# Patient Record
Sex: Male | Born: 1975 | Hispanic: Yes | Marital: Married | Smoking: Never smoker
Health system: Southern US, Community
[De-identification: ages and names within clinical notes are randomized; demographics above are authoritative.]

## PROBLEM LIST (undated history)

## (undated) DIAGNOSIS — I10 Essential (primary) hypertension: Secondary | ICD-10-CM

## (undated) HISTORY — PX: APPENDECTOMY: SHX54

## (undated) HISTORY — DX: Essential (primary) hypertension: I10

---

## 2014-11-07 DIAGNOSIS — S83209A Unspecified tear of unspecified meniscus, current injury, unspecified knee, initial encounter: Secondary | ICD-10-CM | POA: Insufficient documentation

## 2015-08-25 DIAGNOSIS — E785 Hyperlipidemia, unspecified: Secondary | ICD-10-CM | POA: Insufficient documentation

## 2015-08-25 DIAGNOSIS — R195 Other fecal abnormalities: Secondary | ICD-10-CM | POA: Insufficient documentation

## 2020-03-26 ENCOUNTER — Encounter: Payer: Self-pay | Admitting: Emergency Medicine

## 2020-03-26 ENCOUNTER — Emergency Department
Admission: EM | Admit: 2020-03-26 | Discharge: 2020-03-26 | Disposition: A | Discharge status: Home / Self Care | Payer: Self-pay | Source: Home / Self Care | Attending: Family Medicine | Admitting: Family Medicine

## 2020-03-26 ENCOUNTER — Other Ambulatory Visit: Payer: Self-pay

## 2020-03-26 DIAGNOSIS — I10 Essential (primary) hypertension: Secondary | ICD-10-CM

## 2020-03-26 LAB — POCT URINALYSIS DIP (MANUAL ENTRY)
Bilirubin, UA: NEGATIVE
Glucose, UA: NEGATIVE mg/dL
Ketones, POC UA: NEGATIVE mg/dL
Leukocytes, UA: NEGATIVE
Nitrite, UA: NEGATIVE
Protein Ur, POC: NEGATIVE mg/dL
Spec Grav, UA: 1.02 (ref 1.010–1.025)
Urobilinogen, UA: 0.2 E.U./dL
pH, UA: 7.5 (ref 5.0–8.0)

## 2020-03-26 MED ORDER — AMLODIPINE BESYLATE 5 MG PO TABS
5.0000 mg | ORAL_TABLET | Freq: Every day | ORAL | 0 refills | Status: DC
Start: 2020-03-26 — End: 2020-04-03

## 2020-03-26 NOTE — Discharge Instructions (Addendum)
Please monitor your blood pressure several times weekly, at different times of the day, record on a calendar with times of measurement and take this record to your Family Doctor.  

## 2020-03-26 NOTE — ED Triage Notes (Signed)
Pt has an appt w/ pcp on 05/27/20. Original appointment was on 03/28/20. Health Insurance changed. BP has been ok per pt Denies swelling in lower extremities Increased sleepiness during the day Had a COVID vaccine

## 2020-03-29 NOTE — ED Provider Notes (Signed)
[facility]    CSN: [id number] Arrival date & time: 03/26/20  7858      History   Chief Complaint Chief Complaint  Patient presents with  . Hypertension    HPI Eugene Olsen is a 44 y.o. male.   Patient was started on amlodipine 5mg  daily for hypertension one month ago after having a full negative work-up for chest pain in the ED.  He originally had a follow-up appointment with a PCP on 03/28/20, but his insurance changed and his follow-up appointment is now 05/27/20. He reports that he has been compliant with the medication and denies adverse effects except mild sleepiness during the day.   The history is provided by the patient.    Past Medical History:  Diagnosis Date  . Hypertension     Patient Active Problem List   Diagnosis Date Noted  . Heme positive stool 08/25/2015  . Hyperlipidemia 08/25/2015  . Tear of meniscus of knee 11/07/2014    Past Surgical History:  Procedure Laterality Date  . APPENDECTOMY         Home Medications    Prior to Admission medications   Medication Sig Start Date End Date Taking? Authorizing Provider  amLODipine (NORVASC) 5 MG tablet Take 1 tablet (5 mg total) by mouth daily. 03/26/20   03/28/20, MD    Family History Family History  Problem Relation Age of Onset  . Hypertension Mother   . Hypertension Father   . Hypertension Brother     Social History Social History   Tobacco Use  . Smoking status: Never Smoker  . Smokeless tobacco: Never Used  Vaping Use  . Vaping Use: Never used  Substance Use Topics  . Alcohol use: Yes    Alcohol/week: 2.0 standard drinks    Types: 2 Standard drinks or equivalent per week  . Drug use: Never     Allergies   Patient has no known allergies.   Review of Systems Review of Systems  Constitutional: Negative for activity change, appetite change, chills, diaphoresis, fatigue and fever.  HENT: Negative.   Eyes: Negative for visual disturbance.    Respiratory: Negative.   Cardiovascular: Negative for chest pain, palpitations and leg swelling.  Genitourinary: Negative.   Musculoskeletal: Negative.   Skin: Negative.   Neurological: Negative for headaches.     Physical Exam Triage Vital Signs ED Triage Vitals  Enc Vitals Group     BP 03/26/20 0854 (!) 137/92     Pulse Rate 03/26/20 0854 72     Resp 03/26/20 0854 15     Temp 03/26/20 0854 98.6 F (37 C)     Temp Source 03/26/20 0854 Oral     SpO2 03/26/20 0854 98 %     Weight 03/26/20 0850 20 lb (9.072 kg)     Height 03/26/20 0850 5\' 10"  (1.778 m)     Head Circumference --      Peak Flow --      Pain Score 03/26/20 0855 0     Pain Loc --      Pain Edu? --      Excl. in GC? --    No data found.  Updated Vital Signs BP (!) 137/92 (BP Location: Right Arm)   Pulse 72   Temp 98.6 F (37 C) (Oral)   Resp 15   Ht 5\' 10"  (1.778 m)   Wt 9.072 kg   SpO2 98%   BMI 2.87 kg/m   Visual Acuity Right Eye Distance:  Left Eye Distance:   Bilateral Distance:    Right Eye Near:   Left Eye Near:    Bilateral Near:     Physical Exam Nursing notes and Vital Signs reviewed. Appearance:  Patient appears stated age, and in no acute distress.    Eyes:  Pupils are equal, round, and reactive to light and accomodation.  Extraocular movement is intact.  Conjunctivae are not inflamed.  Fundi benign.   Pharynx:  Normal; moist mucous membranes  Neck:  Supple.  No adenopathy Lungs:  Clear to auscultation.  Breath sounds are equal.  Moving air well. Heart:  Regular rate and rhythm without murmurs, rubs, or gallops.  Abdomen:  Nontender without masses or hepatosplenomegaly.  Bowel sounds are present.  No CVA or flank tenderness.  Extremities:  No edema.  Skin:  No rash present.     UC Treatments / Results  Labs (all labs ordered are listed, but only abnormal results are displayed) Labs Reviewed  POCT URINALYSIS DIP (MANUAL ENTRY) - Abnormal; Notable for the following components:       Result Value   Blood, UA trace-lysed (*)    All other components within normal limits    EKG   Radiology No results found.  Procedures Procedures (including critical care time)  Medications Ordered in UC Medications - No data to display  Initial Impression / Assessment and Plan / UC Course  I have reviewed the triage vital signs and the nursing notes.  Pertinent labs & imaging results that were available during my care of the patient were reviewed by me and considered in my medical decision making (see chart for details).    Refill amlodipine 5mg  daily; #30, no refill. Followup with Family Doctor in two weeks.  Final Clinical Impressions(s) / UC Diagnoses   Final diagnoses:  Essential hypertension     Discharge Instructions     Please monitor your blood pressure several times weekly, at different times of the day, record on a calendar with times of measurement and take this record to your Family Doctor.    ED Prescriptions    Medication Sig Dispense Auth. Provider   amLODipine (NORVASC) 5 MG tablet Take 1 tablet (5 mg total) by mouth daily. 30 tablet , MD        Lattie Haw, MD 03/29/20 [phone]

## 2020-04-03 ENCOUNTER — Ambulatory Visit (INDEPENDENT_AMBULATORY_CARE_PROVIDER_SITE_OTHER): Payer: 59 | Admitting: Family Medicine

## 2020-04-03 ENCOUNTER — Telehealth: Payer: Self-pay

## 2020-04-03 ENCOUNTER — Encounter: Payer: Self-pay | Admitting: Family Medicine

## 2020-04-03 VITALS — BP 129/84 | HR 77 | Temp 98.3°F | Ht 70.87 in | Wt 227.8 lb

## 2020-04-03 DIAGNOSIS — Z131 Encounter for screening for diabetes mellitus: Secondary | ICD-10-CM | POA: Insufficient documentation

## 2020-04-03 DIAGNOSIS — I10 Essential (primary) hypertension: Secondary | ICD-10-CM | POA: Diagnosis not present

## 2020-04-03 MED ORDER — AMLODIPINE BESYLATE 5 MG PO TABS
5.0000 mg | ORAL_TABLET | Freq: Every day | ORAL | 2 refills | Status: DC
Start: 2020-04-03 — End: 2020-09-30

## 2020-04-03 NOTE — Assessment & Plan Note (Addendum)
Blood pressure is at goal at for age and co-morbidities.  I recommend continuation of amlodipine at 5mg  daily.  In addition they were instructed to follow a low sodium diet with regular exercise to help to maintain adequate control of blood pressure.  Update labs today.

## 2020-04-03 NOTE — Patient Instructions (Signed)
Hipertensión en los adultos °Hypertension, Adult °La presión arterial alta (hipertensión) se produce cuando la fuerza de la sangre bombea a través de las arterias con mucha fuerza. Las arterias son los vasos sanguíneos que transportan la sangre desde el corazón al resto del cuerpo. La hipertensión hace que el corazón haga más esfuerzo para bombear sangre y puede provocar que las arterias se estrechen o endurezcan. La hipertensión no tratada o no controlada puede causar infarto de miocardio, insuficiencia cardíaca, accidente cerebrovascular, enfermedad renal y otros problemas. °Una lectura de la presión arterial consta de un número más alto sobre un número más bajo. En condiciones ideales, la presión arterial debe estar por debajo de 120/80. El primer número (“superior”) es la presión sistólica. Es la medida de la presión de las arterias cuando el corazón late. El segundo número (“inferior”) es la presión diastólica. Es la medida de la presión en las arterias cuando el corazón se relaja. °¿Cuáles son las causas? °Se desconoce la causa exacta de esta afección. Hay algunas afecciones que causan presión arterial alta o están relacionadas con ella. °¿Qué incrementa el riesgo? °Algunos factores de riesgo de hipertensión están bajo su control. Los siguientes factores pueden hacer que sea más propenso a desarrollar esta afección: °· Fumar. °· Tener diabetes mellitus tipo 2, colesterol alto, o ambos. °· No hacer la cantidad suficiente de actividad física o ejercicio. °· Tener sobrepeso. °· Consumir mucha grasa, azúcar, calorías o sal (sodio) en su dieta. °· Beber alcohol en exceso. °Algunos factores de riesgo para la presión arterial alta pueden ser difíciles o imposibles de cambiar. Algunos de estos factores son los siguientes: °· Tener enfermedad renal crónica. °· Tener antecedentes familiares de presión arterial alta. °· Edad. Los riesgos aumentan con la edad. °· Raza. El riesgo es mayor para las personas  afroamericanas. °· Sexo. Antes de los 45 años, los hombres corren más riesgo que las mujeres. Después de los 65 años, las mujeres corren más riesgo que los hombres. °· Tener apnea obstructiva del sueño. °· Estrés. °¿Cuáles son los signos o los síntomas? °Es posible que la presión arterial alta puede no cause síntomas. La presión arterial muy alta (crisis hipertensiva) puede provocar: °· Dolor de cabeza. °· Ansiedad. °· Falta de aire. °· Hemorragia nasal. °· Náuseas y vómitos. °· Cambios en la visión. °· Dolor de pecho intenso. °· Convulsiones. °¿Cómo se diagnostica? °Esta afección se diagnostica al medir su presión arterial mientras se encuentra sentado, con el brazo apoyado sobre una superficie plana, las piernas sin cruzar y los pies bien apoyados en el piso. El brazalete del tensiómetro debe colocarse directamente sobre la piel de la parte superior del brazo y al nivel de su corazón. Debe medirla al menos dos veces en el mismo brazo. Determinadas condiciones pueden causar una diferencia de presión arterial entre el brazo izquierdo y el derecho. °Ciertos factores pueden provocar que las lecturas de la presión arterial sean inferiores o superiores a lo normal por un período corto de tiempo: °· Si su presión arterial es más alta cuando se encuentra en el consultorio del médico que cuando la mide en su hogar, se denomina “hipertensión de bata blanca”. La mayoría de las personas que tienen esta afección no deben ser medicadas. °· Si su presión arterial es más alta en el hogar que cuando se encuentra en el consultorio del médico, se denomina “hipertensión enmascarada”. La mayoría de las personas que tienen esta afección deben ser medicadas para controlar la presión arterial. °Si tiene una lecturas de presión arterial alta durante   una visita o si tiene presión arterial normal con otros factores de riesgo, se le podrá pedir que haga lo siguiente: °· Que regrese otro día para volver a controlar su presión arterial  nuevamente. °· Que se controle la presión arterial en su casa durante 1 semana o más. °Si se le diagnostica hipertensión, es posible que se le realicen otros análisis de sangre o estudios de diagnóstico por imágenes para ayudar a su médico a comprender su riesgo general de tener otras afecciones. °¿Cómo se trata? °Esta afección se trata haciendo cambios saludables en el estilo de vida, tales como ingerir alimentos saludables, realizar más ejercicio y reducir el consumo de alcohol. El médico puede recetarle medicamentos si los cambios en el estilo de vida no son suficientes para lograr controlar la presión arterial y si: °· Su presión arterial sistólica está por encima de 130. °· Su presión arterial diastólica está por encima de 80. °La presión arterial deseada puede variar en función de las enfermedades, la edad y otros factores personales. °Siga estas instrucciones en su casa: °Comida y bebida ° °· Siga una dieta con alto contenido de fibras y potasio, y con bajo contenido de sodio, azúcar agregada y grasas. Un ejemplo de plan alimenticio es la dieta DASH (Dietary Approaches to Stop Hypertension, Métodos alimenticios para detener la hipertensión). Para alimentarse de esta manera: °? Coma mucha fruta y verdura fresca. Trate de que la mitad del plato de cada comida sea de frutas y verduras. °? Coma cereales integrales, como pasta integral, arroz integral o pan integral. Llene aproximadamente un cuarto del plato con cereales integrales. °? Coma y beba productos lácteos con bajo contenido de grasa, como leche descremada o yogur bajo en grasas. °? Evite la ingesta de cortes de carne grasa, carne procesada o curada, y carne de ave con piel. Llene aproximadamente un cuarto del plato con proteínas magras, como pescado, pollo sin piel, frijoles, huevos o tofu. °? Evite ingerir alimentos prehechos y procesados. En general, estos tienen mayor cantidad de sodio, azúcar agregada y grasa. °· Reduzca su ingesta diaria de sodio.  La mayoría de las personas que tienen hipertensión deben comer menos de 1500 mg de sodio por día. °· No beba alcohol si: °? Su médico le indica no hacerlo. °? Está embarazada, puede estar embarazada o está tratando de quedar embarazada. °· Si bebe alcohol: °? Limite la cantidad que bebe a lo siguiente: °§ De 0 a 1 medida por día para las mujeres. °§ De 0 a 2 medidas por día para los hombres. °? Esté atento a la cantidad de alcohol que hay en las bebidas que toma. En los [place], una medida equivale a una botella de cerveza de 12 oz (355 ml), un vaso de vino de 5 oz (148 ml) o un vaso de una bebida alcohólica de alta graduación de 1½ oz (44 ml). °Estilo de vida ° °· Trabaje con su médico para mantener un peso saludable o perder peso. Pregúntele cuál es el peso recomendado para usted. °· Haga al menos 30 minutos de ejercicio la mayoría de los días de la semana. Estas actividades pueden incluir caminar, nadar o andar en bicicleta. °· Incluya ejercicios para fortalecer sus músculos (ejercicios de resistencia), como Pilates o levantamiento de pesas, como parte de su rutina semanal de ejercicios. Intente realizar 30 minutos de este tipo de ejercicios al menos tres días a la semana. °· No consuma ningún producto que contenga nicotina o tabaco, como cigarrillos, cigarrillos electrónicos y tabaco de mascar. Si necesita ayuda para dejar de fumar, consulte al   médico. °· Contrólese la presión arterial en su casa según las indicaciones del médico. °· Concurra a todas las visitas de seguimiento como se lo haya indicado el médico. Esto es importante. °Medicamentos °· Tome los medicamentos de venta libre y los recetados solamente como se lo haya indicado el médico. Siga cuidadosamente las indicaciones. Los medicamentos para la presión arterial deben tomarse según las indicaciones. °· No omita las dosis de medicamentos para la presión arterial. Si lo hace, estará en riesgo de tener problemas y puede hacer que los medicamentos  sean menos eficaces. °· Pregúntele a su médico a qué efectos secundarios o reacciones a los medicamentos debe prestar atención. °Comuníquese con un médico si: °· Piensa que tiene una reacción a un medicamento que está tomando. °· Tiene dolores de cabeza frecuentes (recurrentes). °· Se siente mareado. °· Tiene hinchazón en los tobillos. °· Tiene problemas de visión. °Solicite ayuda inmediatamente si: °· Siente un dolor de cabeza intenso o confusión. °· Siente debilidad inusual o adormecimiento. °· Siente que va a desmayarse. °· Siente un dolor intenso en el pecho o el abdomen. °· Vomita repetidas veces. °· Tiene dificultad para respirar. °Resumen °· La hipertensión se produce cuando la sangre bombea en las arterias con mucha fuerza. Si esta afección no se controla, podría correr riesgo de tener complicaciones graves. °· La presión arterial deseada puede variar en función de las enfermedades, la edad y otros factores personales. Para la mayoría de las personas, una presión arterial normal es menor que 120/80. °· La hipertensión se trata con cambios en el estilo de vida, medicamentos o una combinación de ambos. Los cambios en el estilo de vida incluyen pérdida de peso, ingerir alimentos sanos, seguir una dieta baja en sodio, hacer más ejercicio y limitar el consumo de alcohol. °Esta información no tiene como fin reemplazar el consejo del médico. Asegúrese de hacerle al médico cualquier pregunta que tenga. °Document Revised: 04/07/2018 Document Reviewed: 04/07/2018 °Elsevier Patient Education © 2020 [employer] Inc[employer] ° °

## 2020-04-03 NOTE — Assessment & Plan Note (Addendum)
Overweight with family history of DM. Mildly elevated glucose in the ED previously.  Will check a1c today.

## 2020-04-03 NOTE — Telephone Encounter (Signed)
Copy of patient's Healthcare POA given to [employer] for scanning. (pre-labeled)

## 2020-04-03 NOTE — Progress Notes (Signed)
Pt has Moderna vaccine.  Received patient's Healthcare POA.

## 2020-04-03 NOTE — Progress Notes (Signed)
Eugene Olsen - 45 y.o. male MRN [id number]  Date of birth: 03/02/1976  Subjective Chief Complaint  Patient presents with  . Establish Care   Due to language barrier, an interpreter was present during the history-taking and subsequent discussion (and for part of the physical exam) with this patient.  HPI Eugene Olsen is a 44 y.o. male here today for initial visit.  He has history of HTN and HLD.  He also has a strong family history of colon cancer on his Mother's side.  He was seen the ER about 1 month ago with chest pain.  Found to have elevated BP.  He was started on amlodipine 5mg  daily.  Cardiac work up was negative.  He was seen again for a follow up visit last week at urgent care and BP remained well controlled with amlodipine. He was instructed to follow up with PCP.  He is here today for follow up of his BP and he needs renewal of amlodipine.  Since he has started amlodipine he has not had any chest pain, shortness of breath, palpitations, headache or vision changes.   He has gotten dizzy a couple of times while working.  Denies presyncopal feeling. He would like to have his blood sugar/a1c checked.  He does report that diabetes runs in his family.    ROS:  A comprehensive ROS was completed and negative except as noted per HPI  No Known Allergies  Past Medical History:  Diagnosis Date  . Hypertension     Past Surgical History:  Procedure Laterality Date  . APPENDECTOMY      Social History   Socioeconomic History  . Marital status: Married    Spouse name: Not on file  . Number of children: Not on file  . Years of education: Not on file  . Highest education level: Not on file  Occupational History  . Occupation: Landscaping  Tobacco Use  . Smoking status: Never Smoker  . Smokeless tobacco: Never Used  Vaping Use  . Vaping Use: Never used  Substance and Sexual Activity  . Alcohol use: Yes    Alcohol/week: 2.0 - 3.0 standard drinks    Types: 2 - 3 Cans of beer  per week  . Drug use: Never  . Sexual activity: Yes  Other Topics Concern  . Not on file  Social History Narrative  . Not on file   Social Determinants of Health   Financial Resource Strain:   . Difficulty of Paying Living Expenses: Not on file  Food Insecurity:   . Worried About in the Last Year: Not on file  . Ran Out of Food in the Last Year: Not on file  Transportation Needs:   . Lack of Transportation (Medical): Not on file  . Lack of Transportation (Non-Medical): Not on file  Physical Activity:   . Days of Exercise per Week: Not on file  . Minutes of Exercise per Session: Not on file  Stress:   . Feeling of Stress : Not on file  Social Connections:   . Frequency of Communication with Friends and Family: Not on file  . Frequency of Social Gatherings with Friends and Family: Not on file  . Attends Religious Services: Not on file  . Active Member of Clubs or Organizations: Not on file  . Attends Programme researcher, broadcasting/film/video Meetings: Not on file  . Marital Status: Not on file    Family History  Problem Relation Age of Onset  . Hypertension Mother   .  Hypertension Father   . Hypertension Brother   . Colon cancer Neg Hx        Several family members are deceased due to colon cancer    Health Maintenance  Topic Date Due  . Hepatitis C Screening  Never done  . COVID-19 Vaccine (1) Never done  . HIV Screening  Never done  . TETANUS/TDAP  Never done  . INFLUENZA VACCINE  10/03/2020 (Originally 02/04/2020)     ----------------------------------------------------------------------------------------------------------------------------------------------------------------------------------------------------------------- Physical Exam BP 129/84 (BP Location: Left Arm, Patient Position: Sitting, Cuff Size: Large)   Pulse 77   Temp 98.3 F (36.8 C) (Oral)   Ht 5' 10.87" (1.8 m)   Wt 227 lb 12.8 oz (103.3 kg)   SpO2 100%   BMI 31.89 kg/m   Physical  Exam Constitutional:      Appearance: Normal appearance.  HENT:     Head: Normocephalic and atraumatic.  Eyes:     General: No scleral icterus. Cardiovascular:     Rate and Rhythm: Normal rate and regular rhythm.  Pulmonary:     Effort: Pulmonary effort is normal.     Breath sounds: Normal breath sounds.  Musculoskeletal:     Cervical back: Neck supple.  Neurological:     Mental Status: He is alert.  Psychiatric:        Mood and Affect: Mood normal.        Behavior: Behavior normal.     ------------------------------------------------------------------------------------------------------------------------------------------------------------------------------------------------------------------- Assessment and Plan  Essential hypertension Blood pressure is at goal at for age and co-morbidities.  I recommend continuation of amlodipine at 5mg  daily.  In addition they were instructed to follow a low sodium diet with regular exercise to help to maintain adequate control of blood pressure.  Update labs today.    Screening for diabetes mellitus Overweight with family history of DM. Will check a1c today.    Meds ordered this encounter  Medications  . amLODipine (NORVASC) 5 MG tablet    Sig: Take 1 tablet (5 mg total) by mouth daily.    Dispense:  90 tablet    Refill:  2   Orders Placed This Encounter  Procedures  . COMPLETE METABOLIC PANEL WITH GFR  . CBC  . HgB A1c     Return in about 6 months (around 10/01/2020) for HTN.    This visit occurred during the SARS-CoV-2 public health emergency.  Safety protocols were in place, including screening questions prior to the visit, additional usage of staff PPE, and extensive cleaning of exam room while observing appropriate contact time as indicated for disinfecting solutions.

## 2020-04-04 LAB — CBC
HCT: 44.2 % (ref 38.5–50.0)
Hemoglobin: 14.8 g/dL (ref 13.2–17.1)
MCH: 28.8 pg (ref 27.0–33.0)
MCHC: 33.5 g/dL (ref 32.0–36.0)
MCV: 86.2 fL (ref 80.0–100.0)
MPV: 9.6 fL (ref 7.5–12.5)
Platelets: 307 10*3/uL (ref 140–400)
RBC: 5.13 10*6/uL (ref 4.20–5.80)
RDW: 12.7 % (ref 11.0–15.0)
WBC: 6.4 10*3/uL (ref 3.8–10.8)

## 2020-04-04 LAB — COMPLETE METABOLIC PANEL WITH GFR
AG Ratio: 1.6 (calc) (ref 1.0–2.5)
ALT: 19 U/L (ref 9–46)
AST: 18 U/L (ref 10–40)
Albumin: 4.9 g/dL (ref 3.6–5.1)
Alkaline phosphatase (APISO): 57 U/L (ref 36–130)
BUN: 18 mg/dL (ref 7–25)
CO2: 28 mmol/L (ref 20–32)
Calcium: 10.1 mg/dL (ref 8.6–10.3)
Chloride: 104 mmol/L (ref 98–110)
Creat: 0.78 mg/dL (ref 0.60–1.35)
GFR, Est African American: 127 mL/min/{1.73_m2} (ref >=60)
GFR, Est Non African American: 110 mL/min/{1.73_m2} (ref >=60)
Globulin: 3.1 g/dL (calc) (ref 1.9–3.7)
Glucose, Bld: 89 mg/dL (ref 65–139)
Potassium: 3.9 mmol/L (ref 3.5–5.3)
Sodium: 139 mmol/L (ref 135–146)
Total Bilirubin: 0.4 mg/dL (ref 0.2–1.2)
Total Protein: 8 g/dL (ref 6.1–8.1)

## 2020-04-04 LAB — HEMOGLOBIN A1C
Hgb A1c MFr Bld: 5.4 % of total Hgb (ref <5.7)
Mean Plasma Glucose: 108 (calc)
eAG (mmol/L): 6 (calc)

## 2020-05-28 ENCOUNTER — Ambulatory Visit: Payer: Self-pay | Admitting: Emergency Medicine

## 2020-06-06 ENCOUNTER — Telehealth: Payer: Self-pay

## 2020-06-06 NOTE — Telephone Encounter (Signed)
Documentation left by patient has been signed by Dr. Ashley Royalty.   Given to [employer] for scanning.  Front Desk to contact the family for pick-up.

## 2020-06-07 ENCOUNTER — Telehealth: Payer: Self-pay

## 2020-06-07 NOTE — Telephone Encounter (Signed)
Patient's documentation was completed by Dr. Ashley Royalty.   Original left at [employer] for pick-up.  Patient has been advised.

## 2020-08-01 ENCOUNTER — Other Ambulatory Visit: Payer: Self-pay

## 2020-08-01 ENCOUNTER — Ambulatory Visit (INDEPENDENT_AMBULATORY_CARE_PROVIDER_SITE_OTHER): Payer: 59 | Admitting: Nurse Practitioner

## 2020-08-01 ENCOUNTER — Encounter: Payer: Self-pay | Admitting: Nurse Practitioner

## 2020-08-01 VITALS — BP 135/94 | HR 79 | Wt 240.0 lb

## 2020-08-01 DIAGNOSIS — Z86010 Personal history of colonic polyps: Secondary | ICD-10-CM

## 2020-08-01 DIAGNOSIS — R198 Other specified symptoms and signs involving the digestive system and abdomen: Secondary | ICD-10-CM | POA: Diagnosis not present

## 2020-08-01 DIAGNOSIS — Z1211 Encounter for screening for malignant neoplasm of colon: Secondary | ICD-10-CM

## 2020-08-01 MED ORDER — PANTOPRAZOLE SODIUM 40 MG PO TBEC: 40.0000 mg | DELAYED_RELEASE_TABLET | Freq: Two times a day (BID) | ORAL | 3 refills | Status: AC

## 2020-08-01 MED ORDER — SUCRALFATE 1 G PO TABS: 1.0000 g | ORAL_TABLET | Freq: Three times a day (TID) | ORAL | 1 refills | Status: DC

## 2020-08-01 NOTE — Progress Notes (Signed)
RM-2   Pt.stated it started 2 months ago    having pains in mid section of his stomach  took OTC Maalox  pain only persist at night time.

## 2020-08-01 NOTE — Patient Instructions (Addendum)
lcera pptica Peptic Ulcer  Una lcera pptica es una llaga dolorosa en la membrana que recubre el estmago o la primera parte del intestino delgado. Cules son las causas? Las causas ms frecuentes de esta afeccin incluyen las siguientes:  Infeccin.  El uso de determinados medicamentos con mucha frecuencia o en gran cantidad. Qu incrementa el riesgo? Es ms probable que tenga esta afeccin si:  Fuma.  Tiene antecedentes familiares de lceras.  Bebe alcohol.  Ha sido hospitalizado en una unidad de cuidados intensivos [place]). Cules son los signos o los sntomas? Algunos de los sntomas son los siguientes:  Dolor urente en la zona entre el pecho y el ombligo. El dolor puede tener las siguientes caractersticas: ? No desaparece (es persistente). ? Empeora cuando el estmago est vaco. ? Empeora por la noche.  Acidez estomacal.  Malestar estomacal (nuseas) y vmitos.  Meteorismo. Si la lcera sangra, puede causar que usted:  Tenga materia fecal (heces) de color negro y de aspecto alquitranado.  Vomite sangre roja brillante.  Vomite materia parecida a la borra de caf. Cmo se trata? El tratamiento de esta afeccin puede incluir lo siguiente:  Automotive engineer cosas que pueden causar la lcera, por ejemplo: ? Fumar. ? Tomar analgsicos.  Medicamentos para reducir la Merchant navy officer.  Antibiticos, si la lcera es causada por una infeccin.  Un procedimiento que se realiza con un tubo pequeo y flexible que tiene una cmara en el extremo (endoscopa alta). Este procedimiento puede hacerse si tiene una lcera sangrante.  [place]. Esta puede ser necesaria en los siguientes casos: ? Tiene mucho sangrado. ? La lcera caus una perforacin en algn lugar del aparato digestivo. Siga estas indicaciones en su casa:  No beba alcohol si el mdico se lo prohbe.  Limite la cantidad de cafena que consume.  No consuma ningn producto que contenga nicotina o tabaco,  como cigarrillos, cigarrillos electrnicos y tabaco de Theatre manager. Si necesita ayuda para dejar de fumar, consulte al [employer].  [employer] de venta libre y los recetados solamente como se lo haya indicado el mdico. ? No cambie ni deje de tomar sus medicamentos a menos que consulte al mdico antes. ? No tome aspirina, ibuprofeno ni otros antiinflamatorios no esteroideos (AINE) a menos que el mdico se lo haya indicado.  Concurra a todas las visitas de [address] se lo haya indicado el mdico. Esto es importante. Comunquese con un mdico si:  No mejora luego de 7 das despus de [employer]  Contina sintiendo Journalist, newspaper (indigestin) o acidez estomacal. Solicite ayuda inmediatamente si:  Tiene dolor de vientre (abdomen) repentino y agudo.  Tiene dolor en el vientre que no desaparece.  La materia fecal (heces) es sanguinolenta o negra, de aspecto alquitranado.  Vomita sangre. Esto tiene un aspecto similar al poso del caf.  Se siente mareado o como si se fuera a desmayar.  Se debilita.  Est muy transpirado o se siente pegajoso o fro al tacto (sudoroso). Resumen  Los sntomas de una lcera pptica son dolor urente en la zona entre el pecho y el ombligo.  Tome los medicamentos solamente como se lo haya indicado el mdico.  Limite la cantidad de alcohol y cafena que consume.  Concurra a todas las visitas de seguimiento como se lo haya indicado el mdico. Esta informacin no tiene Theme park manager el consejo del mdico. Asegrese de hacerle al mdico cualquier pregunta que tenga. Document Revised: 02/03/2018 Document Reviewed: 02/03/2018 Elsevier Patient Education  2021 Elsevier  Inc.   Peptic Ulcer  A peptic ulcer is a painful sore in the lining of your stomach or the first part of your small intestine. What are the causes? Common causes of this condition include: An infection. Using certain pain medicines too often or too  much. What increases the risk? You are more likely to get this condition if you: Smoke. Have a family history of ulcer disease. Drink alcohol. Have been hospitalized in an intensive care unit (ICU). What are the signs or symptoms? Symptoms include: Burning pain in the area between the chest and the belly button. The pain may: Not go away (be persistent). Be worse when your stomach is empty. Be worse at night. Heartburn. Feeling sick to your stomach (nauseous) and throwing up (vomiting). Bloating. If the ulcer results in bleeding, it can cause you to: Have poop (stool) that is black and looks like tar. Throw up bright red blood. Throw up material that looks like coffee grounds. How is this treated? Treatment for this condition may include: Stopping things that can cause the ulcer, such as: Smoking. Using pain medicines. Medicines to reduce stomach acid. Antibiotic medicines if the ulcer is caused by an infection. A procedure that is done using a small, flexible tube that has a camera at the end (upper endoscopy). This may be done if you have a bleeding ulcer. Surgery. This may be needed if: You have a lot of bleeding. The ulcer caused a hole somewhere in the digestive system. Follow these instructions at home: Do not drink alcohol if your doctor tells you not to drink. Limit how much caffeine you take in. Do not use any products that contain nicotine or tobacco, such as cigarettes, e-cigarettes, and chewing tobacco. If you need help quitting, ask your doctor. Take over-the-counter and prescription medicines only as told by your doctor. Do not stop or change your medicines unless you talk with your doctor about it first. Do not take aspirin, ibuprofen, or other NSAIDs unless your doctor told you to do so. Keep all follow-up visits as told by your doctor. This is important. Contact a doctor if: You do not get better in 7 days after you start treatment. You keep having an upset  stomach (indigestion) or heartburn. Get help right away if: You have sudden, sharp pain in your belly (abdomen). You have belly pain that does not go away. You have bloody poop (stool) or black, tarry poop. You throw up blood. It may look like coffee grounds. You feel light-headed or feel like you may pass out (faint). You get weak. You get sweaty or feel sticky and cold to the touch (clammy). Summary Symptoms of a peptic ulcer include burning pain in the area between the chest and the belly button. Take medicines only as told by your doctor. Limit how much alcohol and caffeine you have. Keep all follow-up visits as told by your doctor. This information is not intended to replace advice given to you by your health care provider. Make sure you discuss any questions you have with your health care provider. Document Revised: 12/28/2017 Document Reviewed: 12/28/2017 Elsevier Patient Education  2021 Elsevier Inc.    [place] Diet A bland diet consists of foods that are often soft and do not have a lot of fat, fiber, or extra seasonings. Foods without fat, fiber, or seasoning are easier for the body to digest. They are also less likely to irritate your mouth, throat, stomach, and other parts of your digestive system. A bland  diet is sometimes called a BRAT diet. What is my plan? Your health care provider or food and nutrition specialist (dietitian) may recommend specific changes to your diet to prevent symptoms or to treat your symptoms. These changes may include:  Eating small meals often.  Cooking food until it is soft enough to chew easily.  Chewing your food well.  Drinking fluids slowly.  Not eating foods that are very spicy, sour, or fatty.  Not eating citrus fruits, such as oranges and grapefruit. What do I need to know about this diet?  Eat a variety of foods from the bland diet food list.  Do not follow a bland diet longer than needed.  Ask your health care provider whether  you should take vitamins or supplements. What foods can I eat? Grains Hot cereals, such as cream of wheat. Rice. Bread, crackers, or tortillas made from refined white flour.   Vegetables Canned or cooked vegetables. Mashed or boiled potatoes. Fruits Bananas. Applesauce. Other types of cooked or canned fruit with the skin and seeds removed, such as canned peaches or pears.   Meats and other proteins Scrambled eggs. Creamy peanut butter or other nut butters. Lean, well-cooked meats, such as chicken or fish. Tofu. Soups or broths.   Dairy Low-fat dairy products, such as milk, cottage cheese, or yogurt. Beverages Water. Herbal tea. Apple juice.   Fats and oils Mild salad dressings. Canola or olive oil. Sweets and desserts Pudding. Custard. Fruit gelatin. Ice cream. The items listed above may not be a complete list of recommended foods and beverages. Contact a dietitian for more options. What foods are not recommended? Grains Whole grain breads and cereals. Vegetables Raw vegetables. Fruits Raw fruits, especially citrus, berries, or dried fruits. Dairy Whole fat dairy foods. Beverages Caffeinated drinks. Alcohol. Seasonings and condiments Strongly flavored seasonings or condiments. Hot sauce. Salsa. Other foods Spicy foods. Fried foods. Sour foods, such as pickled or fermented foods. Foods with high sugar content. Foods high in fiber. The items listed above may not be a complete list of foods and beverages to avoid. Contact a dietitian for more information. Summary  A bland diet consists of foods that are often soft and do not have a lot of fat, fiber, or extra seasonings.  Foods without fat, fiber, or seasoning are easier for the body to digest.  Check with your health care provider to see how long you should follow this diet plan. It is not meant to be followed for long periods. This information is not intended to replace advice given to you by your health care provider. Make  sure you discuss any questions you have with your health care provider. Document Revised: 07/21/2017 Document Reviewed: 07/21/2017 Elsevier Patient Education  2021 [employer].

## 2020-08-01 NOTE — Assessment & Plan Note (Signed)
Symptoms and presentation consistent with peptic ulcer. Will start with treatment today of pantoprazole BID x 2 weeks then taper to once a day for [redacted] weeks along with carafate 4 times a day for 4-6 weeks.  Dietary recommendations provided.  If symptoms are not improved with treatment, recommend testing for H. Pylori.  Patient to follow-up if symptoms worsen or fail to improve.

## 2020-08-01 NOTE — Progress Notes (Signed)
Acute Office Visit  Subjective:    Patient ID: Eugene Olsen, male    DOB: 12/28/1975, 45 y.o.   MRN: [id number]  Chief Complaint  Patient presents with  . Abdominal Pain    HPI Patient is in today for pain in the "pit" of his stomach.   ABDOMINAL PAIN  Duration:months Onset: onset over time, but not necessarily worsening Severity: 8/10 Quality: ill-defined and sore Location:  LUQ, RUQ, peri-umbilical, LLQ, RLQ, diffuse and suprapubic". "lower abdominal quadrants  Episode duration:  Radiation: no Frequency: intermittent Alleviating factors:  Aggravating factors: Status: better, worse, stable and fluctuating Treatments attempted: antacids Fever: no Nausea: no Vomiting: no Weight loss: no Decreased appetite: no Diarrhea: no Constipation: no Blood in stool: no Heartburn: yes Jaundice: no Rash: no Recurrent NSAID use: no   Past Medical History:  Diagnosis Date  . Hypertension     Past Surgical History:  Procedure Laterality Date  . APPENDECTOMY      Family History  Problem Relation Age of Onset  . Hypertension Mother   . Hypertension Father   . Hypertension Brother   . Colon cancer Neg Hx        Several family members are deceased due to colon cancer    Social History   Socioeconomic History  . Marital status: Married    Spouse name: Not on file  . Number of children: Not on file  . Years of education: Not on file  . Highest education level: Not on file  Occupational History  . Occupation: Landscaping  Tobacco Use  . Smoking status: Never Smoker  . Smokeless tobacco: Never Used  Vaping Use  . Vaping Use: Never used  Substance and Sexual Activity  . Alcohol use: Yes    Alcohol/week: 2.0 - 3.0 standard drinks    Types: 2 - 3 Cans of beer per week  . Drug use: Never  . Sexual activity: Yes  Other Topics Concern  . Not on file  Social History Narrative  . Not on file   Social Determinants of Health   Financial Resource Strain: Not on  file  Food Insecurity: Not on file  Transportation Needs: Not on file  Physical Activity: Not on file  Stress: Not on file  Social Connections: Not on file  Intimate Partner Violence: Not on file    Outpatient Medications Prior to Visit  Medication Sig Dispense Refill  . amLODipine (NORVASC) 5 MG tablet Take 1 tablet (5 mg total) by mouth daily. 90 tablet 2   No facility-administered medications prior to visit.    No Known Allergies  Review of Systems All review of systems negative except what is listed in the HPI     Objective:    Physical Exam Vitals and nursing note reviewed.  Constitutional:      Appearance: Normal appearance.  HENT:     Head: Normocephalic.  Eyes:     Extraocular Movements: Extraocular movements intact.     Conjunctiva/sclera: Conjunctivae normal.     Pupils: Pupils are equal, round, and reactive to light.  Neck:     Vascular: No carotid bruit.  Cardiovascular:     Rate and Rhythm: Normal rate and regular rhythm.     Pulses: Normal pulses.     Heart sounds: Normal heart sounds.  Pulmonary:     Effort: Pulmonary effort is normal.     Breath sounds: Normal breath sounds.  Abdominal:     General: Abdomen is flat. Bowel sounds are normal.  Palpations: Abdomen is soft.     Tenderness: There is abdominal tenderness in the epigastric area. There is no right CVA tenderness, left CVA tenderness, guarding or rebound. Negative signs include Murphy's sign, Rovsing's sign and McBurney's sign.  Musculoskeletal:        General: Normal range of motion.     Right lower leg: No edema.     Left lower leg: No edema.  Skin:    General: Skin is warm and dry.     Capillary Refill: Capillary refill takes less than 2 seconds.  Neurological:     General: No focal deficit present.     Mental Status: He is alert and oriented to person, place, and time.  Psychiatric:        Mood and Affect: Mood normal.        Behavior: Behavior normal.        Thought Content:  Thought content normal.        Judgment: Judgment normal.     BP (!) 135/94   Pulse 79   Wt 240 lb (108.9 kg)   SpO2 98%   BMI 33.60 kg/m  Wt Readings from Last 3 Encounters:  08/01/20 240 lb (108.9 kg)  04/03/20 227 lb 12.8 oz (103.3 kg)  03/26/20 20 lb (9.072 kg)    Health Maintenance Due  Topic Date Due  . Hepatitis C Screening  Never done  . HIV Screening  Never done  . TETANUS/TDAP  Never done    There are no preventive care reminders to display for this patient.   No results found for: TSH Lab Results  Component Value Date   WBC 6.4 04/03/2020   HGB 14.8 04/03/2020   HCT 44.2 04/03/2020   MCV 86.2 04/03/2020   PLT 307 04/03/2020   Lab Results  Component Value Date   NA 139 04/03/2020   K 3.9 04/03/2020   CO2 28 04/03/2020   GLUCOSE 89 04/03/2020   BUN 18 04/03/2020   CREATININE 0.78 04/03/2020   BILITOT 0.4 04/03/2020   AST 18 04/03/2020   ALT 19 04/03/2020   PROT 8.0 04/03/2020   CALCIUM 10.1 04/03/2020   No results found for: CHOL No results found for: HDL No results found for: LDLCALC No results found for: TRIG No results found for: CHOLHDL Lab Results  Component Value Date   HGBA1C 5.4 04/03/2020       Assessment & Plan:   Problem List Items Addressed This Visit      Other   Peptic ulcer symptoms - Primary    Symptoms and presentation consistent with peptic ulcer. Will start with treatment today of pantoprazole BID x 2 weeks then taper to once a day for [redacted] weeks along with carafate 4 times a day for 4-6 weeks.  Dietary recommendations provided.  If symptoms are not improved with treatment, recommend testing for H. Pylori.  Patient to follow-up if symptoms worsen or fail to improve.       Relevant Medications   pantoprazole (PROTONIX) 40 MG tablet   sucralfate (CARAFATE) 1 g tablet    Other Visit Diagnoses    History of colon polyps       Referral for colonoscopy sent- patient reports he is due for 5 year check up    Relevant  Orders   Ambulatory referral to Gastroenterology   Colon cancer screening       order for colonoscopy sent- patient reports he is due for 5 year monitoring due to colon polyps  Relevant Orders   Ambulatory referral to Gastroenterology       Meds ordered this encounter  Medications  . pantoprazole (PROTONIX) 40 MG tablet    Sig: Take 1 tablet (40 mg total) by mouth 2 (two) times daily. For two weeks then decrease to 1 (one) time a day for four more weeks.    Dispense:  60 tablet    Refill:  3  . sucralfate (CARAFATE) 1 g tablet    Sig: Take 1 tablet (1 g total) by mouth 4 (four) times daily -  with meals and at bedtime. If symptoms are gone, may stop after 4 weeks.    Dispense:  120 tablet    Refill:  1   Return if symptoms worsen or fail to improve.   Tollie Eth, NP

## 2020-08-04 ENCOUNTER — Other Ambulatory Visit: Payer: Self-pay

## 2020-08-04 ENCOUNTER — Emergency Department
Admission: EM | Admit: 2020-08-04 | Discharge: 2020-08-04 | Disposition: A | Discharge status: Home / Self Care | Payer: 59 | Source: Home / Self Care | Attending: Family Medicine | Admitting: Family Medicine

## 2020-08-04 DIAGNOSIS — R1013 Epigastric pain: Secondary | ICD-10-CM

## 2020-08-04 MED ORDER — TRAMADOL-ACETAMINOPHEN 37.5-325 MG PO TABS: 1.0000 | ORAL_TABLET | Freq: Four times a day (QID) | ORAL | 0 refills | Status: DC | PRN

## 2020-08-04 NOTE — Discharge Instructions (Signed)
Continue taking the medicines that were prescribed to you earlier this week Take Ultracet if needed when pain is severe Caution drowsiness I will contact your primary care doctor to see about getting additional testing and specialist appointment

## 2020-08-04 NOTE — ED Triage Notes (Signed)
Pt states that he has some abdominal pain x3 weeks

## 2020-08-05 ENCOUNTER — Telehealth: Payer: Self-pay | Admitting: Emergency Medicine

## 2020-08-05 NOTE — ED Provider Notes (Signed)
[facility]    CSN: [id number] Arrival date & time: 08/04/20  1412      History   Chief Complaint Chief Complaint  Patient presents with  . Abdominal Pain    HPI Eugene Olsen is a 45 y.o. male.   HPI   Patient was seen 3 days ago by his primary care provider.  They diagnosed him with possible peptic ulcers disease.  The doctor prescribed Protonix 40 mg twice daily as well as Carafate.  He has been taking these for almost 3 days.  He states that the pain is becoming worse over time and is now what he considers intolerable.  He is here for further evaluation.  He expresses he feels blood work should have been done.  He is scheduled to follow-up with gastroenterology but does not know when this might happen.  He does have a primary care doctor to follow-up with. States the pain is unrelated to meals.  It is present all day long.  He states it slightly improved when he lays down at night.  He has some nausea but no vomiting.  No problems with bowels.  No fever or chills.  No prior history of upper GI problems ulcers or reflux. Denies cigarette smoking Denies alcohol ingestion Denies use of NSAID drugs  Past Medical History:  Diagnosis Date  . Hypertension     Patient Active Problem List   Diagnosis Date Noted  . Peptic ulcer symptoms 08/01/2020  . Essential hypertension 04/03/2020  . Screening for diabetes mellitus 04/03/2020  . Heme positive stool 08/25/2015  . Hyperlipidemia 08/25/2015  . Tear of meniscus of knee 11/07/2014    Past Surgical History:  Procedure Laterality Date  . APPENDECTOMY         Home Medications    Prior to Admission medications   Medication Sig Start Date End Date Taking? Authorizing Provider  amLODipine (NORVASC) 5 MG tablet Take 1 tablet (5 mg total) by mouth daily. 04/03/20  Yes Everrett Coombe, DO  pantoprazole (PROTONIX) 40 MG tablet Take 1 tablet (40 mg total) by mouth 2 (two) times daily. For two weeks then decrease to 1  (one) time a day for four more weeks. 08/01/20  Yes Early, Sung Amabile, NP  sucralfate (CARAFATE) 1 g tablet Take 1 tablet (1 g total) by mouth 4 (four) times daily -  with meals and at bedtime. If symptoms are gone, may stop after 4 weeks. 08/01/20  Yes Early, Sung Amabile, NP  traMADol-acetaminophen (ULTRACET) 37.5-325 MG tablet Take 1-2 tablets by mouth every 6 (six) hours as needed. 08/04/20  Yes Eustace Moore, MD    Family History Family History  Problem Relation Age of Onset  . Hypertension Mother   . Hypertension Father   . Hypertension Brother   . Colon cancer Neg Hx        Several family members are deceased due to colon cancer    Social History Social History   Tobacco Use  . Smoking status: Never Smoker  . Smokeless tobacco: Never Used  Vaping Use  . Vaping Use: Never used  Substance Use Topics  . Alcohol use: Yes    Alcohol/week: 2.0 - 3.0 standard drinks    Types: 2 - 3 Cans of beer per week  . Drug use: Never     Allergies   Patient has no known allergies.   Review of Systems Review of Systems See HPI Physical Exam Triage Vital Signs ED Triage Vitals  Enc Vitals  Group     BP 08/04/20 1543 131/86     Pulse Rate 08/04/20 1543 81     Resp --      Temp 08/04/20 1543 98.1 F (36.7 C)     Temp Source 08/04/20 1543 Oral     SpO2 08/04/20 1543 99 %     Weight 08/04/20 1541 220 lb (99.8 kg)     Height 08/04/20 1541 5\' 10"  (1.778 m)     Head Circumference --      Peak Flow --      Pain Score 08/04/20 1541 7     Pain Loc --      Pain Edu? --      Excl. in GC? --    No data found.  Updated Vital Signs BP 131/86 (BP Location: Left Arm)   Pulse 81   Temp 98.1 F (36.7 C) (Oral)   Ht 5\' 10"  (1.778 m)   Wt 99.8 kg   SpO2 99%   BMI 31.57 kg/m      Physical Exam Constitutional:      General: He is not in acute distress.    Appearance: He is well-developed and well-nourished.     Comments: Appears comfortable  HENT:     Head: Normocephalic and  atraumatic.     Mouth/Throat:     Mouth: Oropharynx is clear and moist.  Eyes:     Conjunctiva/sclera: Conjunctivae normal.     Pupils: Pupils are equal, round, and reactive to light.  Cardiovascular:     Rate and Rhythm: Normal rate.  Pulmonary:     Effort: Pulmonary effort is normal. No respiratory distress.  Abdominal:     General: Bowel sounds are normal. There is no distension.     Palpations: Abdomen is soft.     Tenderness: There is abdominal tenderness in the epigastric area. There is no right CVA tenderness, left CVA tenderness, guarding or rebound.  Musculoskeletal:        General: No edema. Normal range of motion.     Cervical back: Normal range of motion.  Skin:    General: Skin is warm and dry.  Neurological:     Mental Status: He is alert.      UC Treatments / Results  Labs (all labs ordered are listed, but only abnormal results are displayed) Labs Reviewed  COMPLETE METABOLIC PANEL WITH GFR  LIPASE  CBC WITH DIFFERENTIAL/PLATELET    EKG   Radiology No results found.  Procedures Procedures (including critical care time)  Medications Ordered in UC Medications - No data to display  Initial Impression / Assessment and Plan / UC Course  I have reviewed the triage vital signs and the nursing notes.  Pertinent labs & imaging results that were available during my care of the patient were reviewed by me and considered in my medical decision making (see chart for details).     I explained to the patient that I can get some blood work that his family doctor will follow up with but I am unable to obtain any additional imaging or testing at the urgent care center on a Sunday.  I have encouraged him to call his primary care doctor for follow-up. Final Clinical Impressions(s) / UC Diagnoses   Final diagnoses:  Epigastric pain     Discharge Instructions     Continue taking the medicines that were prescribed to you earlier this week Take Ultracet if  needed when pain is severe Caution drowsiness I will contact  your primary care doctor to see about getting additional testing and specialist appointment    ED Prescriptions    Medication Sig Dispense Auth. Provider   traMADol-acetaminophen (ULTRACET) 37.5-325 MG tablet Take 1-2 tablets by mouth every 6 (six) hours as needed. 20 tablet Eustace Moore, MD     I have reviewed the PDMP during this encounter.   Eustace Moore, MD 08/05/20 [phone]

## 2020-08-05 NOTE — Telephone Encounter (Signed)
Call about labs drawn yesterday - in process. Pt continues to have some abdominal pain - will call pt later on today if results are back - labs picked up this am. Pt will go to ER if symptoms are worse or he becomes febrile

## 2020-08-06 LAB — CBC WITH DIFFERENTIAL/PLATELET
Absolute Monocytes: 763 {cells}/uL (ref 200–950)
Basophils Absolute: 43 {cells}/uL (ref 0–200)
Basophils Relative: 0.6 %
Eosinophils Absolute: 101 {cells}/uL (ref 15–500)
Eosinophils Relative: 1.4 %
HCT: 45.3 % (ref 38.5–50.0)
Hemoglobin: 15.2 g/dL (ref 13.2–17.1)
Lymphs Abs: 3002 {cells}/uL (ref 850–3900)
MCH: 28.8 pg (ref 27.0–33.0)
MCHC: 33.6 g/dL (ref 32.0–36.0)
MCV: 85.8 fL (ref 80.0–100.0)
MPV: 9.7 fL (ref 7.5–12.5)
Monocytes Relative: 10.6 %
Neutro Abs: 3290 {cells}/uL (ref 1500–7800)
Neutrophils Relative %: 45.7 %
Platelets: 330 10*3/uL (ref 140–400)
RBC: 5.28 Million/uL (ref 4.20–5.80)
RDW: 13.1 % (ref 11.0–15.0)
Total Lymphocyte: 41.7 %
WBC: 7.2 10*3/uL (ref 3.8–10.8)

## 2020-08-06 LAB — COMPLETE METABOLIC PANEL WITHOUT GFR
AG Ratio: 1.4 (calc) (ref 1.0–2.5)
ALT: 22 U/L (ref 9–46)
AST: 19 U/L (ref 10–40)
Albumin: 4.6 g/dL (ref 3.6–5.1)
Alkaline phosphatase (APISO): 60 U/L (ref 36–130)
BUN: 20 mg/dL (ref 7–25)
CO2: 24 mmol/L (ref 20–32)
Calcium: 9.7 mg/dL (ref 8.6–10.3)
Chloride: 103 mmol/L (ref 98–110)
Creat: 0.9 mg/dL (ref 0.60–1.35)
GFR, Est African American: 120 mL/min/{1.73_m2}
GFR, Est Non African American: 104 mL/min/{1.73_m2}
Globulin: 3.2 g/dL (ref 1.9–3.7)
Glucose, Bld: 83 mg/dL (ref 65–99)
Potassium: 4.4 mmol/L (ref 3.5–5.3)
Sodium: 140 mmol/L (ref 135–146)
Total Bilirubin: 0.4 mg/dL (ref 0.2–1.2)
Total Protein: 7.8 g/dL (ref 6.1–8.1)

## 2020-08-06 LAB — SPECIMEN COMPROMISED

## 2020-08-06 LAB — LIPASE: Lipase: 22 U/L (ref 7–60)

## 2020-08-07 ENCOUNTER — Telehealth: Payer: Self-pay | Admitting: Emergency Medicine

## 2020-08-07 NOTE — Telephone Encounter (Signed)
Labs normal, Dr Lindaann Slough note said she would contact his PCP for referral he is still having pain.

## 2020-08-15 ENCOUNTER — Ambulatory Visit: Payer: Self-pay | Admitting: Emergency Medicine

## 2020-08-21 ENCOUNTER — Telehealth: Payer: Self-pay | Admitting: Gastroenterology

## 2020-08-21 NOTE — Telephone Encounter (Signed)
Hi Dr. Myrtie Neither, we have received a referral from patient's PCP for a repeat colon. Patient had colon in 2017 and is requesting to be your patient. Records have been received and they will be sent to you for review. Please advise on scheduling. Thank you.

## 2020-08-22 ENCOUNTER — Encounter: Payer: Self-pay | Admitting: Nurse Practitioner

## 2020-08-22 NOTE — Telephone Encounter (Signed)
Hi Dr. Myrtie Neither, this pt called this morning stating that he had been dealing with abdominal pain recently so I scheduled him for an ov with Gunnar Fusi on 09/05/20. Pls disregard my previous message. Thank you.

## 2020-08-22 NOTE — Telephone Encounter (Signed)
Understood, thanks

## 2020-08-30 ENCOUNTER — Other Ambulatory Visit: Payer: Self-pay

## 2020-09-05 ENCOUNTER — Ambulatory Visit (INDEPENDENT_AMBULATORY_CARE_PROVIDER_SITE_OTHER): Payer: 59 | Admitting: Nurse Practitioner

## 2020-09-05 ENCOUNTER — Other Ambulatory Visit: Payer: 59

## 2020-09-05 ENCOUNTER — Encounter: Payer: Self-pay | Admitting: Nurse Practitioner

## 2020-09-05 VITALS — BP 150/100 | HR 92 | Ht 70.0 in | Wt 244.0 lb

## 2020-09-05 DIAGNOSIS — Z800 Family history of malignant neoplasm of digestive organs: Secondary | ICD-10-CM | POA: Diagnosis not present

## 2020-09-05 DIAGNOSIS — A048 Other specified bacterial intestinal infections: Secondary | ICD-10-CM

## 2020-09-05 DIAGNOSIS — R110 Nausea: Secondary | ICD-10-CM

## 2020-09-05 DIAGNOSIS — R1010 Upper abdominal pain, unspecified: Secondary | ICD-10-CM | POA: Diagnosis not present

## 2020-09-05 MED ORDER — PLENVU 140 G PO SOLR: ORAL | 0 refills | Status: DC

## 2020-09-05 NOTE — Progress Notes (Signed)
ASSESSMENT AND PLAN     #45 year old mostly Spanish speaking male with upper abdominal pain and nausea. Recent positive H.pylori ab.  Symptoms improved but not resolved .  No acute findings on abdominal ultrasound.  No cholelithiasis or gallbladder wall thickening.  Liver chemistries, lipase, and CBC are normal Rule out PUD or gastritis  --Patient has 2 days left of triple therapy.  After completion of treatment and off PPI for 2 weeks will check for H. pylori eradication.  --We talked about Carafate which he is still taking 3-4 times a day.  At this point he can take only as needed for abdominal discomfort --Further evaluation patient will be scheduled for EGD. The risks and benefits of EGD were discussed and the patient agrees to proceed.   # Hepatomegaly with probable hepatic steatosis.  Normal liver chemistries September 2021 and again January 2022  # Focal right liver lesion on recent ultrasound measuring 1.5 x 1.4. 1.5 cm, hemangioma favored. --We will discuss findings with Dr. Myrtie Neither, he can decide whether more advanced imaging is warranted to better characterize the lesion  # Strong family history of colon cancer in several maternal aunts/uncles.  Patient had an unremarkable colonoscopy in [facility] in 2017.  He was advised to have follow-up colonoscopy in 5 years which will be March of this year.  --We will go ahead and get patient scheduled for screening colonoscopy which can be done at time of EGD. Patient will be scheduled for a colonoscopy. The risks and benefits of colonoscopy with possible polypectomy / biopsies were discussed and the patient agrees to proceed.    HISTORY OF PRESENT ILLNESS     Primary Gastroenterologist : Requesting Dr.Danis Chief Complaint : upper abdominal pain   Eugene Olsen is a 45 y.o. male with PMH not limited to hypertension, H. pylori infection, appendectomy  *Patient is Spanish-speaking, interpreter is present.  Patient is new to the  practice, requesting to be under the care of Dr. Myrtie Neither.  He is referred by PCP for evaluation of abdominal pain.  Patient saw PCP late January for mid to upper abdominal pain.  He was started on pantoprazole twice daily for 2 weeks then advised to taper to once daily for 4 weeks.  He was also started on Carafate 4 times daily .  In late January labs including CMP, lipase and CBC were normal .  His symptoms persisted and he was seen [facility] ED on 08/27/2020 .he had a abdominal ultrasound showing mild hepatomegaly with probable steatosis, H. pylori antibody was obtained and positive.  He is on Biaxin , amoxicillin, and PPI.  He reports that his upper abdominal pain has improved but has not resolved.  The epigastric pain does not radiate through to the back, it seems worse on an empty stomach.  Has a strong family history of colon cancer.  His mother had polyps in her 58s. All of his maternal aunts and uncles had colon cancer but at 50 years of age or older.  Patient had a colonoscopy March 2017 in [place].  An ascending colon polyp was removed but biopsy showed that it was an intramucosal lymphoid nodule.  No adenomatous tissue was found a 5-year recall colonoscopy was recommended  Data Reviewed:  08/27/2020 abdominal ultrasound at [facility] 1. No acute sonographic abnormality.  2. Mild hepatomegaly with suggestion of underlying steatosis.  3. Focal right hepatic lesion is favored benign such as a hepatic hemangioma in the background of  steatosis. However, recommend further evaluation with contrast-enhanced liver protocol MRI to exclude malignant etiologies.   Previous Endoscopic Evaluations / Pertinent Studies:  March 2017 colonoscopy in [facility] for strong family history of colon cancer --Exam was complete, bowel preparation good. --A single sessile polyp was removed the descending colon but pathology proved it to be an intramucosal lymphoid nodule.  No adenomatous  change  Past Medical History:  Diagnosis Date  . Hypertension      Past Surgical History:  Procedure Laterality Date  . APPENDECTOMY     Family History  Problem Relation Age of Onset  . Hypertension Mother   . Colon polyps Mother   . Hypertension Father   . Hypertension Brother   . Colon cancer Maternal Aunt    Social History   Tobacco Use  . Smoking status: Never Smoker  . Smokeless tobacco: Never Used  Vaping Use  . Vaping Use: Never used  Substance Use Topics  . Alcohol use: Yes    Alcohol/week: 2.0 - 3.0 standard drinks    Types: 2 - 3 Cans of beer per week  . Drug use: Never   Current Outpatient Medications  Medication Sig Dispense Refill  . amLODipine (NORVASC) 5 MG tablet Take 1 tablet (5 mg total) by mouth daily. 90 tablet 2  . amoxicillin (AMOXIL) 500 MG tablet Take by mouth.    . clarithromycin (BIAXIN) 500 MG tablet Take by mouth.    . pantoprazole (PROTONIX) 40 MG tablet Take 1 tablet (40 mg total) by mouth 2 (two) times daily. For two weeks then decrease to 1 (one) time a day for four more weeks. 60 tablet 3  . sucralfate (CARAFATE) 1 g tablet Take 1 tablet (1 g total) by mouth 4 (four) times daily -  with meals and at bedtime. If symptoms are gone, may stop after 4 weeks. 120 tablet 1  . traMADol-acetaminophen (ULTRACET) 37.5-325 MG tablet Take 1-2 tablets by mouth every 6 (six) hours as needed. 20 tablet 0   No current facility-administered medications for this visit.   No Known Allergies   Review of Systems: All other systems reviewed and negative except where noted in HPI.   PHYSICAL EXAM :    Wt Readings from Last 3 Encounters:  09/05/20 244 lb (110.7 kg)  08/04/20 220 lb (99.8 kg)  08/01/20 240 lb (108.9 kg)    BP (!) 150/100   Pulse 92   Ht 5\' 10"  (1.778 m)   Wt 244 lb (110.7 kg)   BMI 35.01 kg/m  Constitutional:  Pleasant Hispanic male in no acute distress. Psychiatric: Normal mood and affect. Behavior is normal. EENT: Pupils  normal.  Conjunctivae are normal. No scleral icterus. Neck supple.  Cardiovascular: Normal rate, regular rhythm. No edema Pulmonary/chest: Effort normal and breath sounds normal. No wheezing, rales or rhonchi. Abdominal: Soft, nondistended, nontender. Bowel sounds active throughout. There are no masses palpable. No hepatomegaly. Neurological: Alert and oriented to person place and time. Skin: Skin is warm and dry. No rashes noted.  , NP  09/05/2020, 10:12 AM  Cc:  Referring Provider 11/05/2020, NP

## 2020-09-05 NOTE — Patient Instructions (Addendum)
Si tiene 64 aos o menos, su ndice de Standard Pacific corporal debe estar entre 19 y 57. Tu ndice de masa corporal es de 35,01 kg/m. Si esto est fuera del rango mencionado anteriormente, considere hacer un seguimiento con su proveedor de Marine scientist.  PROCEDIMIENTOS:  Se le ha programado una endoscopia y [place] colonoscopia. Siga las instrucciones escritas que se le dieron en su visita de hoy. Recoja sus suministros de preparacin en la farmacia dentro de los prximos 1 a 3 das. Si [place] inhaladores (aunque solo sea necesario), trigalos el da de su procedimiento.  Tendremos que repetir la prueba de heces para H. Pylori 2 semanas despus de que termine el medicamento.  Fue genial verte hoy! Gracias por confiarme su atencin y elegir [facility].  Darron Doom, NP

## 2020-09-06 NOTE — Progress Notes (Signed)
[name],  Please let patient know that Dr. Myrtie Neither reviewed his scan and is recommending an MRI of the liver with and without contrast to better evaluate a liver lesion.  Please let him know the liver lesion appears benign on CT scan but MRI will further clarify. Thanks

## 2020-09-06 NOTE — Progress Notes (Signed)
____________________________________________________________  Attending physician addendum:  Thank you for sending this case to me. I have reviewed the entire note and agree with the plan.  Also, the liver lesion is described as likely benign, but the radiologist is recommending an MRI of the liver with and without contrast for better evaluation.  Please have our clinical staff notify the patient and make the arrangements.  Amada Jupiter, MD  ____________________________________________________________

## 2020-09-09 ENCOUNTER — Telehealth: Payer: Self-pay

## 2020-09-09 ENCOUNTER — Other Ambulatory Visit: Payer: Self-pay

## 2020-09-09 DIAGNOSIS — R932 Abnormal findings on diagnostic imaging of liver and biliary tract: Secondary | ICD-10-CM

## 2020-09-09 NOTE — Telephone Encounter (Signed)
Called patient via interpreter: Eugene Olsen [phone] and gave Dr. Myrtie Neither review of his CT. Also scheduled the MRI w/ & w/o contrast at [facility] on 09/16/20. Patient to arrive at 6:30am and be NPO 4 hours before. Patient in agreement with this appt.

## 2020-09-09 NOTE — Telephone Encounter (Signed)
-----   Message from Meredith Pel, NP sent at 09/06/2020  3:32 PM EST -----   ----- Message ----- From: Sherrilyn Rist, MD Sent: 09/06/2020   3:23 PM EST To: Meredith Pel, NP    ----- Message ----- From: Meredith Pel, NP Sent: 09/05/2020  12:39 PM EST To: Sherrilyn Rist, MD

## 2020-09-16 ENCOUNTER — Ambulatory Visit (HOSPITAL_COMMUNITY)
Admission: RE | Admit: 2020-09-16 | Discharge: 2020-09-16 | Disposition: A | Discharge status: Home / Self Care | Payer: 59 | Source: Ambulatory Visit | Attending: Nurse Practitioner | Admitting: Nurse Practitioner

## 2020-09-16 ENCOUNTER — Other Ambulatory Visit: Payer: Self-pay

## 2020-09-16 DIAGNOSIS — R932 Abnormal findings on diagnostic imaging of liver and biliary tract: Secondary | ICD-10-CM | POA: Insufficient documentation

## 2020-09-16 IMAGING — MR MR ABDOMEN WO/W CM
16 series · 48 of 48 positions shown · IV contrast (10 GADAVIST)
Comparison: None.

CLINICAL DATA: Abdominal pain and nausea. Indeterminate lesion
within liver on ultrasound.

EXAM:
MRI ABDOMEN WITHOUT AND WITH CONTRAST
TECHNIQUE: Multiplanar multisequence MR imaging of the abdomen was performed
both before and after the administration of intravenous contrast.
CONTRAST:  10mL GADAVIST GADOBUTROL 1 MMOL/ML IV SOLN

[Series 3: haste_cor_mbh · coronal · 6.0mm · 1.56mm/px · 1 of 42 slices shown]
[im 1/42]
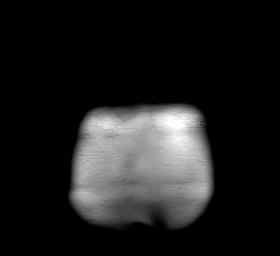

[Series 4: ax_trufi_mbh · axial · 6.0mm · 1.20mm/px · 1 of 46 slices shown]
[im 1/46]
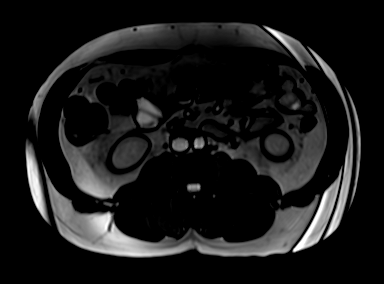

[Series 5: T2 fat-sat · axial · 6.0mm · 1.25mm/px · 1 of 39 slices shown]
[im 1/39]
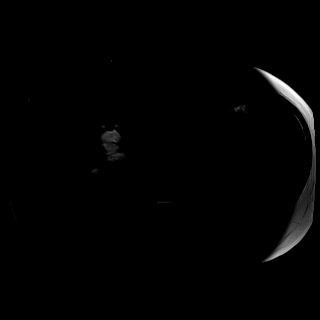

[Series 7: ax_diff_fb_tracew_dfc_mix · axial · 6.0mm · 1.72mm/px · z∈[-169,+145]mm · 2 of 90 slices shown]
[im 1/90]
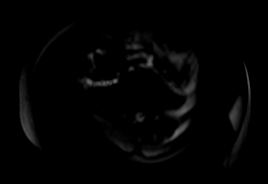
[im 90/90]
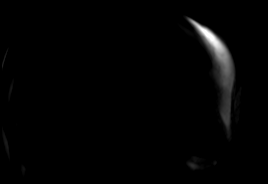

[Series 8: ax_diff_fb_adc_dfc_mix · axial · 6.0mm · 1.72mm/px · z∈[-169,+145]mm · 2 of 45 slices shown]
[im 1/45]
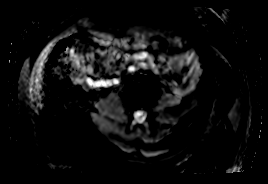
[im 45/45]
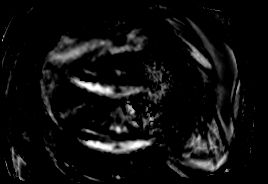

[Series 13: T1 dynamic · axial · 3.0mm · 1.44mm/px · z∈[-187,+98]mm · 4 of 96 slices shown (1 of 7)]
[im 1/96]
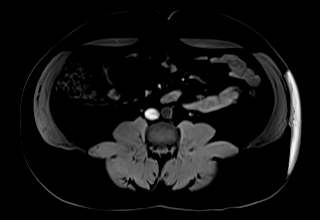
[im 32/96]
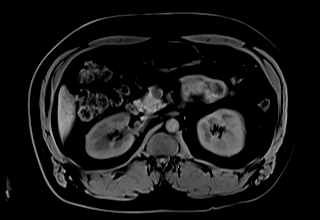
[im 64/96]
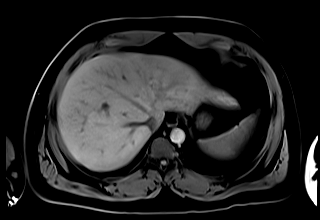
[im 96/96]
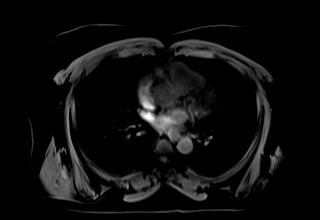

[Series 15: T1 dynamic · axial · 3.0mm · 1.44mm/px · z∈[-187,+98]mm · 4 of 96 slices shown (2 of 7)]
[im 1/96]
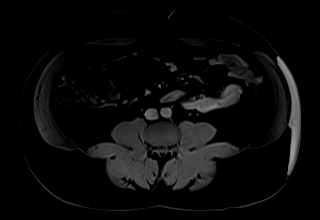
[im 32/96]
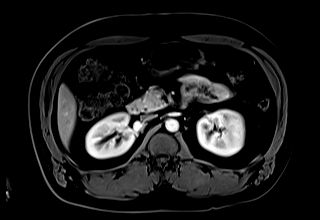
[im 64/96]
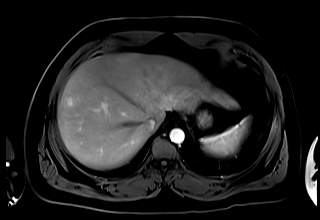
[im 96/96]
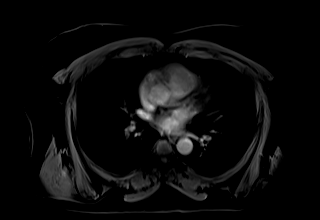

[Series 16: T1 dynamic · axial · 3.0mm · 1.44mm/px · z∈[-187,+98]mm · 4 of 96 slices shown (3 of 7)]
[im 1/96]
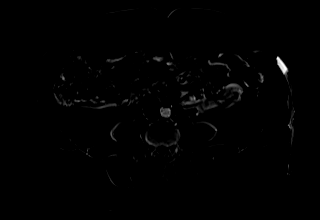
[im 32/96]
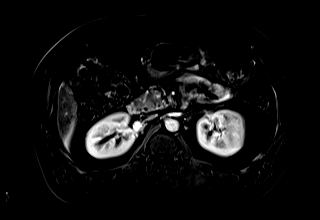
[im 64/96]
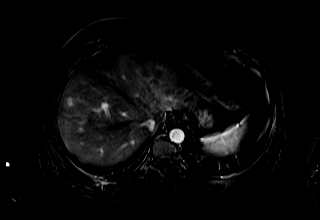
[im 96/96]
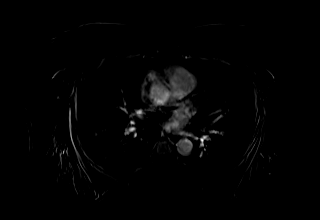

[Series 18: T1 dynamic · axial · 3.0mm · 1.44mm/px · z∈[-187,+98]mm · 4 of 96 slices shown (4 of 7)]
[im 1/96]
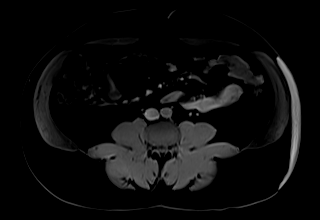
[im 32/96]
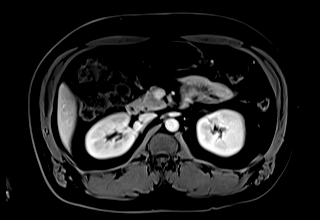
[im 64/96]
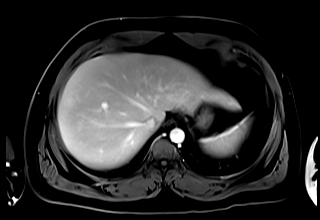
[im 96/96]
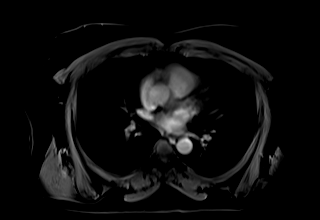

[Series 19: T1 dynamic · axial · 3.0mm · 1.44mm/px · z∈[-187,+98]mm · 4 of 96 slices shown (5 of 7)]
[im 1/96]
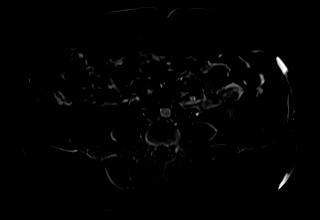
[im 32/96]
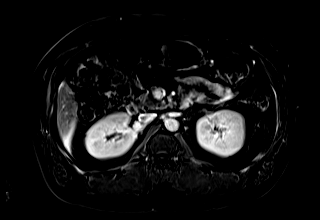
[im 64/96]
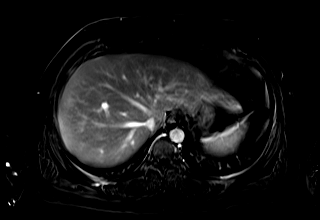
[im 96/96]
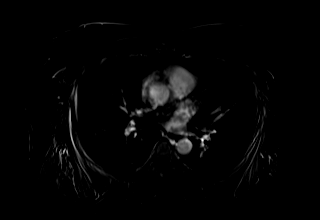

[Series 21: T1 dynamic · axial · 3.0mm · 1.44mm/px · z∈[-187,+98]mm · 4 of 96 slices shown (6 of 7)]
[im 1/96]
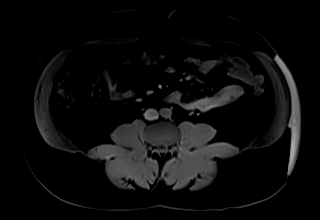
[im 32/96]
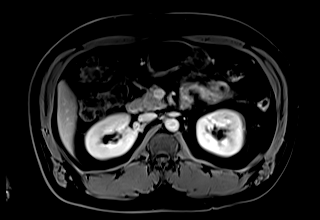
[im 64/96]
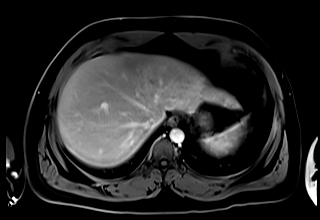
[im 96/96]
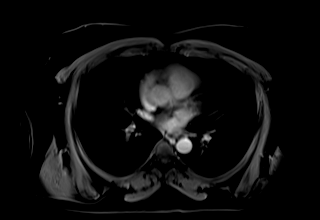

[Series 22: T1 dynamic · axial · 3.0mm · 1.44mm/px · z∈[-187,+98]mm · 4 of 96 slices shown (7 of 7)]
[im 1/96]
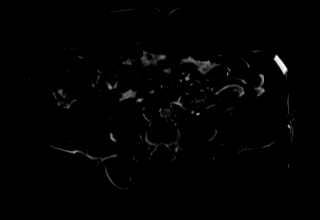
[im 32/96]
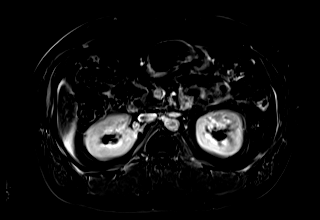
[im 64/96]
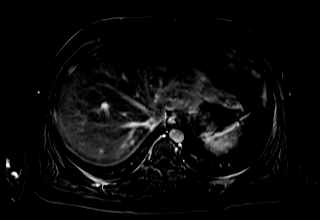
[im 96/96]
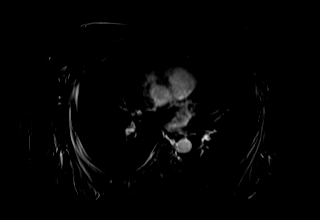

[Series 23: ax_haste_mbh · axial · 6.0mm · 1.44mm/px · z∈[-193,+95]mm · 2 of 41 slices shown]
[im 1/41]
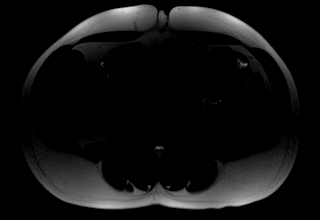
[im 41/41]
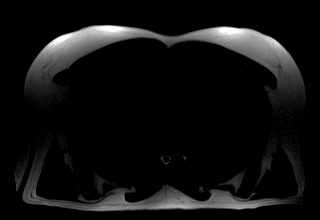

[Series 25: cor_vibe_dixon_delayed_w · coronal · 3.0mm · 1.56mm/px · 3 of 80 slices shown]
[im 1/80]
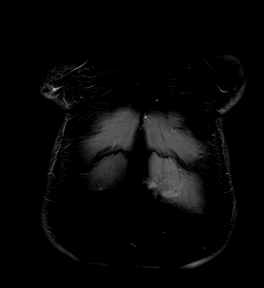
[im 40/80]
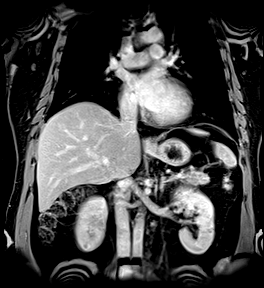
[im 80/80]
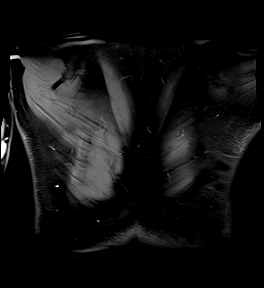

[Series 27: ax_dixon_delayed_w_reg · axial · 3.0mm · 1.44mm/px · z∈[-187,+98]mm · 4 of 96 slices shown]
[im 1/96]
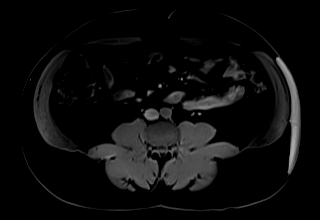
[im 32/96]
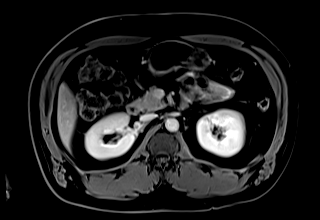
[im 64/96]
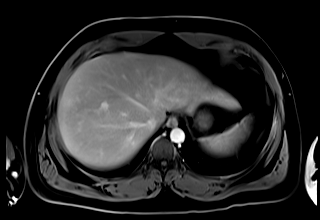
[im 96/96]
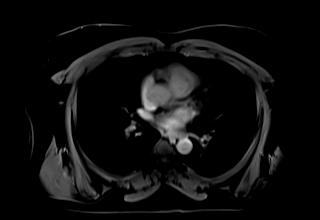

[Series 28: ax_dixon_delayed_w_reg_sub · axial · 3.0mm · 1.44mm/px · z∈[-187,+98]mm · 4 of 96 slices shown]
[im 1/96]
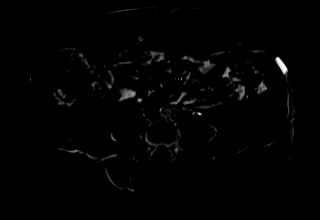
[im 32/96]
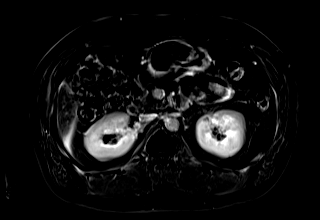
[im 64/96]
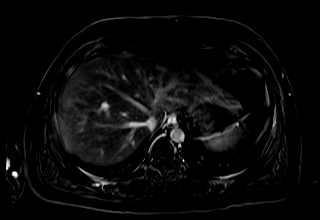
[im 96/96]
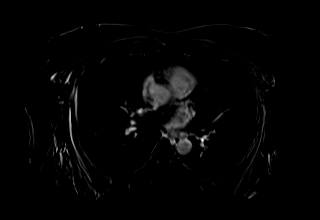

[48 of 48 positions shown; findings below may reference images not displayed]

FINDINGS: Lower chest:  Lung bases are clear.

Hepatobiliary: Within the RIGHT hepatic lobe, there is early
enhancing lesion measuring 11 mm (image 31/15). This lesion is
isointense to the hepatic vasculature on more delayed imaging
(series 18 and image 35/series 27). Lesion is only subtly evident on
T2 weighted imaging (image 14/series 5) which is not typical of a
hemangioma. Lesion is more intense on the T2 weighted diffusion
weighted imaging (image [DATE]). No restricted diffusion.

No additional hepatic lesions. Gallbladder normal. Common bile duct
normal.

Pancreas: Normal pancreatic parenchymal intensity. No ductal
dilatation or inflammation.

Spleen: Normal spleen.

Adrenals/urinary tract: Adrenal glands and kidneys are normal.

Stomach/Bowel: Stomach and limited of the small bowel is
unremarkable

Vascular/Lymphatic: Abdominal aortic normal caliber. No
retroperitoneal periportal lymphadenopathy.

Musculoskeletal: No aggressive osseous lesion
IMPRESSION: 1. Small enhancing lesion within the RIGHT hepatic lobe has
indeterminate imaging characteristics. Favor flash filling
hemangioma versus focal nodular hyperplasia. Recommend MRI with
hepatocytes specific contrast agent (Eovist) in 3 months.
2. Normal biliary tree pancreas.

These results will be called to the ordering clinician or
representative by the Radiologist Assistant, and communication
documented in the PACS or [REDACTED].

## 2020-09-16 MED ORDER — GADOBUTROL 1 MMOL/ML IV SOLN
10.0000 mL | Freq: Once | INTRAVENOUS | Status: AC | PRN
  Administered 2020-09-16: 10 mL via INTRAVENOUS

## 2020-09-30 ENCOUNTER — Other Ambulatory Visit: Payer: Self-pay

## 2020-09-30 ENCOUNTER — Encounter: Payer: Self-pay | Admitting: Family Medicine

## 2020-09-30 ENCOUNTER — Ambulatory Visit (INDEPENDENT_AMBULATORY_CARE_PROVIDER_SITE_OTHER): Payer: 59 | Admitting: Family Medicine

## 2020-09-30 VITALS — BP 135/78 | HR 68 | Temp 97.7°F | Ht 70.0 in | Wt 236.0 lb

## 2020-09-30 DIAGNOSIS — R198 Other specified symptoms and signs involving the digestive system and abdomen: Secondary | ICD-10-CM | POA: Diagnosis not present

## 2020-09-30 DIAGNOSIS — Z13220 Encounter for screening for lipoid disorders: Secondary | ICD-10-CM | POA: Diagnosis not present

## 2020-09-30 DIAGNOSIS — I10 Essential (primary) hypertension: Secondary | ICD-10-CM

## 2020-09-30 MED ORDER — AMLODIPINE BESYLATE 10 MG PO TABS: 10.0000 mg | ORAL_TABLET | Freq: Every day | ORAL | 3 refills | Status: AC

## 2020-09-30 NOTE — Patient Instructions (Addendum)
Continue amlodipine at 10mg  daily.  Follow a low sodium diet.  Have labs completed.  See me again in 6 months.     .pdf">  Plan de alimentacin DASH DASH Eating Plan DASH es la sigla en ingls de "Enfoques Alimentarios para Detener la Hipertensin". El plan de alimentacin DASH ha demostrado:  Bajar la presin arterial elevada (hipertensin).  Reducir el riesgo de diabetes tipo 2, enfermedad cardaca y accidente cerebrovascular.  Ayudar a perder peso. Consejos para seguir https://www.mata.com/ las etiquetas de los alimentos  Verifique la cantidad de sal (sodio) por porcin en las etiquetas de los alimentos. Elija alimentos con menos del 5 por ciento del valor diario de sodio. Generalmente, los alimentos con menos de 300 miligramos (mg) de sodio por porcin se encuadran dentro de este plan alimentario.  Para encontrar cereales integrales, busque la palabra "integral" como primera palabra en la lista de ingredientes. Al ir de compras  Compre productos en los que en su etiqueta diga: "bajo contenido de sodio" o "sin agregado de sal".  Compre alimentos frescos. Evite los alimentos enlatados y comidas precocidas o congeladas. Al cocinar  Evite agregar sal cuando cocine. Use hierbas o aderezos sin sal, en lugar de sal de mesa o sal marina. Consulte al mdico o farmacutico antes de usar sustitutos de la sal.  No fra los alimentos. A la hora de cocinar los alimentos opte por hornearlos, hervirlos, grillarlos, asarlos al horno y asarlos a Consulting civil engineer.  Cocine con aceites cardiosaludables, como oliva, canola, aguacate, soja o girasol. Planificacin de las comidas  Consuma una dieta equilibrada, que incluya lo siguiente: ? 4o ms porciones de frutas y 4 o ms porciones de Patent attorney. Trate de que medio plato de cada comida sea de frutas y verduras. ? De 6 a 8porciones de cereales Warehouse manager. ? Menos de 6 onzas  (170g) de carne, aves o pescado [employer]. Una porcin de 3 onzas (85g) de carne tiene casi el mismo tamao que un mazo de cartas. Un huevo equivale a 1 onza (28g). ? De 2 a 3 porciones de productos lcteos descremados por da. Una porcin es 1taza (Copy). ? 1 porcin de frutos secos, semillas o frijoles 5 veces por semana. ? De 2 a 3 porciones de grasas cardiosaludables. Las grasas saludables llamadas cidos grasos omega-3 se encuentran en alimentos como las nueces, las semillas de [place], las leches fortificadas y [place]. Estas grasas tambin se encuentran en los pescados de agua fra, como la sardina, el salmn y la caballa.  Limite la cantidad que consume de: ? Alimentos enlatados o envasados. ? Alimentos con alto contenido de grasa trans, como algunos alimentos fritos. ? Alimentos con alto contenido de grasa saturada, como carne con grasa. ? Postres y otros dulces, bebidas azucaradas y otros alimentos con azcar agregada. ? Productos lcteos enteros.  No le agregue sal a los alimentos antes de probarlos.  No coma ms de 4 yemas de huevo por semana.  Trate de comer al menos 2 comidas vegetarianas por semana.  Consuma ms comida casera y menos de restaurante, de bares y comida rpida.   Estilo de vida  Cuando coma en un restaurante, pida que preparen su comida con menos sal o, en lo posible, sin nada de sal.  Si bebe alcohol: ? Limite la cantidad que bebe:  De 0 a 1 medida por da para las mujeres que no estn embarazadas.  De 0 a 2 medidas por da para los  hombres. ? Est atento a la cantidad de alcohol que hay en las bebidas que toma. En los [place], una medida equivale a una botella de cerveza de 12oz ( ), un vaso de vino de 5oz ( ) o un vaso de una bebida alcohlica de alta graduacin de 1oz (3ml). Informacin general  Evite ingerir ms de 2300 mg de sal por da. Si tiene hipertensin, es posible que necesite reducir la ingesta de sodio a  1,500 mg por da.  Trabaje con su mdico para mantener un peso saludable o perder [employer]. Pregntele cul es el peso recomendado para usted.  Realice al menos 30 minutos de ejercicio que haga que se acelere su corazn (ejercicio Magazine features editor) la [employer]V de la [place]. Estas actividades pueden incluir caminar, nadar o andar en bicicleta.  Trabaje con su mdico o nutricionista para ajustar su plan alimentario a sus necesidades calricas personales. Qu alimentos debo comer? Frutas Todas las frutas frescas, congeladas o disecadas. Frutas enlatadas en jugo natural (sin agregado de azcar). Verduras Verduras frescas o congeladas (crudas, al vapor, asadas o grilladas). Jugos de tomate y verduras con bajo contenido de sodio o reducidos en sodio. Salsa y pasta de tomate con bajo contenido de sodio o reducidas en sodio. Verduras enlatadas con bajo contenido de sodio o reducidas en sodio. Granos Pan de salvado o integral. Pasta de salvado o integral. Arroz integral. Avena. Quinua. Trigo burgol. Cereales integrales y con bajo contenido de [place]. Pan pita. Galletitas de [place] con bajo contenido de [place] (the [place] of 60 deg [place]) y [place]. Tortillas de [place] integral. Carnes y otras protenas Pollo o pavo sin piel. Carne de pollo o de [place]. Cerdo desgrasado. Pescado y [employer]. Claras de huevo. Porotos, guisantes o lentejas secos. Frutos secos, mantequilla de frutos secos y semillas sin sal. Frijoles enlatados sin sal. Cortes de carne vacuna magra, desgrasada. Carne precocida o curada magra y baja en sodio, como embutidos o panes de carne. Lcteos Leche descremada (1%) o descremada. Quesos reducidos en grasa, con bajo contenido de grasa o descremados. Queso blanco o ricota sin grasa, con bajo contenido de [place]. Yogur semidescremado o descremado. Queso con bajo contenido de [place] y [place]. Grasas y Hers[employer] untables que no contengan grasas trans. Aceite vegetal. Jerolyn Shin y aderezos para ensaladas livianos,  reducidos en grasa o con bajo contenido de grasas (reducidos en sodio). Aceite de canola, crtamo, oliva, aguacate, soja y [place]. Aguacate. Alios y condimentos Hierbas. Especias. Mezclas de condimentos sin sal. Otros alimentos Palomitas de maz y pretzels sin sal. Dulces con bajo contenido de grasas. Es posible que los productos que se enumeran ms [place] no constituyan una lista completa de los alimentos y las bebidas que puede tomar. Consulte a un nutricionista para obtener ms informacin. Qu alimentos debo evitar? Nils Pyle Fruta enlatada en almbar liviano o espeso. Frutas cocidas en aceite. Frutas con salsa de crema o mantequilla. Verduras Verduras con crema o fritas. Verduras en salsa de [place]. Verduras enlatadas regulares (que no sean con bajo contenido de sodio o reducidas en sodio). Pasta y salsa de tomates enlatadas regulares (que no sean con bajo contenido de sodio o reducidas en sodio). Jugos de tomate y verduras regulares (que no sean con bajo contenido de sodio o reducidos en sodio). Pepinillos. Aceitunas. Granos Productos de panificacin hechos con grasa, como medialunas, magdalenas y algunos panes. Comidas con arroz o pasta seca listas para usar. Carnes y [address] de carne con alto contenido de Holiday representative. Costillas. Carne frita. [place]ocino. [place], salame y otras carnes  precocidas o curadas, como embutidos o panes de carne. Grasa de la espalda del cerdo (panceta). Salchicha de cerdo. Frutos secos y semillas con sal. Frijoles enlatados con agregado de sal. Pescado enlatado o ahumado. Huevos enteros o yemas. Pollo o pavo con piel. Lcteos Leche entera o al 2%, crema y [address] y mitad crema. Queso crema entero o con toda su grasa. Yogur entero o endulzado. Quesos con toda su grasa. Sustitutos de cremas no lcteas. Coberturas batidas. Quesos para untar y quesos procesados. Grasas y Barnes & Noble. Margarina en barra. Manteca de cerdo. Lardo. Mantequilla  clarificada. Grasa de panceta. Aceites tropicales como aceite de coco, palmiste o palma. Alios y condimentos Sal de cebolla, sal de ajo, sal condimentada, sal de mesa y sal marina. Salsa Worcestershire. Salsa trtara. Salsa barbacoa. Salsa teriyaki. Salsa de soja, incluso la que tiene contenido reducido de [place]. Salsa de carne. Salsas en lata y envasadas. Salsa de pescado. Salsa de [place]. Salsa rosada. Rbanos picantes comprados en tiendas. [place]. Mostaza. Saborizantes y tiernizantes para carne. Caldo en cubitos. Salsas picantes. Adobos preelaborados o envasados. Aderezos para tacos preelaborados o envasados. Salsas de pepinillos. Aderezos comunes para ensalada. Otros alimentos Palomitas de maz y pretzels con sal. Es posible que los productos que se enumeran ms arriba no constituyan una lista completa de los alimentos y las bebidas que Personnel officer. Consulte a un nutricionista para obtener ms informacin. Dnde buscar ms informacin  National Heart, Lung, and Blood Institute (Instituto Nacional del Rough and Ready, los Pulmones y Risk manager): PopSteam.is  American Heart Association (Asociacin Estadounidense del Corazn): www.heart.org  Academy of Nutrition and Dietetics (Academia de Nutricin y Pension scheme manager): www.eatright.org  National Kidney Foundation (Fundacin Nacional del Rin): www.kidney.org Resumen  El plan de alimentacin DASH ha demostrado bajar la presin arterial elevada (hipertensin). Tambin puede reducir [employer] International de diabetes tipo 2, enfermedad cardaca y accidente cerebrovascular.  Cuando siga el plan de alimentacin DASH, trate de comer ms frutas frescas y verduras, cereales integrales, carnes magras, lcteos descremados y grasas cardiosaludables.  Con el plan de alimentacin DASH, deber limitar el consumo de sal (sodio) a 2,300 mg por da. Si tiene hipertensin, es posible que necesite reducir la ingesta de sodio a 1,500 mg por da.  Trabaje con su mdico o  nutricionista para ajustar su plan alimentario a sus necesidades calricas personales. Esta informacin no tiene Theme park manager el consejo del mdico. Asegrese de hacerle al mdico cualquier pregunta que tenga. Document Revised: 07/27/2019 Document Reviewed: 07/27/2019 Elsevier Patient Education  2021 [employer].

## 2020-09-30 NOTE — Progress Notes (Signed)
Eugene Olsen - 45 y.o. male MRN [id number]  Date of birth: [birth year]  Subjective Chief Complaint  Patient presents with  . Hypertension    HPI Eugene Olsen is a 45 y.o. male here today for follow up of HTN.  He was taking amlodipine 5mg  daily but noted that BP remained elevated at home and has started taking 10mg  recently.  He is doing well at current dose.  He denies side effects and has not had symptoms related to HTN including chest pain, shortness of breath, palpitations, headache or vision changes.    He has upcoming EGD for follow up of peptic ulcer.  Had treatment for H. Pylori recently.  Denies symptoms at this time.   ROS:  A comprehensive ROS was completed and negative except as noted per HPI  No Known Allergies  Past Medical History:  Diagnosis Date  . Hypertension     Past Surgical History:  Procedure Laterality Date  . APPENDECTOMY      Social History   Socioeconomic History  . Marital status: Married    Spouse name: Not on file  . Number of children: Not on file  . Years of education: Not on file  . Highest education level: Not on file  Occupational History  . Occupation: Landscaping  Tobacco Use  . Smoking status: Never Smoker  . Smokeless tobacco: Never Used  Vaping Use  . Vaping Use: Never used  Substance and Sexual Activity  . Alcohol use: Yes    Alcohol/week: 2.0 - 3.0 standard drinks    Types: 2 - 3 Cans of beer per week  . Drug use: Never  . Sexual activity: Yes  Other Topics Concern  . Not on file  Social History Narrative  . Not on file   Social Determinants of Health   Financial Resource Strain: Not on file  Food Insecurity: Not on file  Transportation Needs: Not on file  Physical Activity: Not on file  Stress: Not on file  Social Connections: Not on file    Family History  Problem Relation Age of Onset  . Hypertension Mother   . Colon polyps Mother   . Hypertension Father   . Hypertension Brother   . Colon cancer  Maternal Aunt     Health Maintenance  Topic Date Due  . Hepatitis C Screening  Never done  . HIV Screening  Never done  . TETANUS/TDAP  Never done  . INFLUENZA VACCINE  10/03/2020 (Originally 02/04/2020)  . COVID-19 Vaccine  Completed  . HPV VACCINES  Aged Out     ----------------------------------------------------------------------------------------------------------------------------------------------------------------------------------------------------------------- Physical Exam BP 135/78   Pulse 68   Temp 97.7 F (36.5 C)   Ht 5\' 10"  (1.778 m)   Wt 236 lb (107 kg)   SpO2 100%   BMI 33.86 kg/m   Physical Exam Constitutional:      Appearance: Normal appearance.  Eyes:     General: No scleral icterus. Cardiovascular:     Rate and Rhythm: Normal rate and regular rhythm.  Pulmonary:     Effort: Pulmonary effort is normal.     Breath sounds: Normal breath sounds.  Skin:    General: Skin is warm and dry.  Neurological:     General: No focal deficit present.     Mental Status: He is alert.  Psychiatric:        Mood and Affect: Mood normal.        Behavior: Behavior normal.     ------------------------------------------------------------------------------------------------------------------------------------------------------------------------------------------------------------------- Assessment and Plan  Essential hypertension Blood pressure is at goal at for age and co-morbidities.  I recommend continuation of amlodipine at 10mg  daily. .  In addition they were instructed to follow a low sodium diet with regular exercise to help to maintain adequate control of blood pressure.    Peptic ulcer symptoms Improved with tx of H. Pylori.  Has upcoming repeat EGD.     Meds ordered this encounter  Medications  . amLODipine (NORVASC) 10 MG tablet    Sig: Take 1 tablet (10 mg total) by mouth daily.    Dispense:  90 tablet    Refill:  3    Return in about 6 months  (around 04/02/2021) for HTN.    This visit occurred during the SARS-CoV-2 public health emergency.  Safety protocols were in place, including screening questions prior to the visit, additional usage of staff PPE, and extensive cleaning of exam room while observing appropriate contact time as indicated for disinfecting solutions.

## 2020-09-30 NOTE — Assessment & Plan Note (Signed)
Blood pressure is at goal at for age and co-morbidities.  I recommend continuation of amlodipine at 10mg  daily. .  In addition they were instructed to follow a low sodium diet with regular exercise to help to maintain adequate control of blood pressure.

## 2020-09-30 NOTE — Assessment & Plan Note (Signed)
Improved with tx of H. Pylori.  Has upcoming repeat EGD.

## 2020-10-01 ENCOUNTER — Other Ambulatory Visit: Payer: 59

## 2020-10-01 DIAGNOSIS — A048 Other specified bacterial intestinal infections: Secondary | ICD-10-CM

## 2020-10-01 DIAGNOSIS — Z800 Family history of malignant neoplasm of digestive organs: Secondary | ICD-10-CM

## 2020-10-03 LAB — H. PYLORI ANTIGEN, STOOL: H pylori Ag, Stl: NEGATIVE

## 2020-10-14 ENCOUNTER — Telehealth: Payer: Self-pay | Admitting: Nurse Practitioner

## 2020-10-14 MED ORDER — PLENVU 140 G PO SOLR: ORAL | 0 refills | Status: DC

## 2020-10-14 NOTE — Telephone Encounter (Signed)
RX sent

## 2020-10-14 NOTE — Telephone Encounter (Signed)
Inbound call from patient's wife requesting Plenvu script be sent to [facility] pharmacy in chart please; pharmacy stated they never received it.

## 2020-10-16 ENCOUNTER — Ambulatory Visit (AMBULATORY_SURGERY_CENTER): Payer: 59 | Admitting: Gastroenterology

## 2020-10-16 ENCOUNTER — Other Ambulatory Visit: Payer: Self-pay | Admitting: Gastroenterology

## 2020-10-16 ENCOUNTER — Other Ambulatory Visit: Payer: Self-pay

## 2020-10-16 ENCOUNTER — Encounter: Payer: Self-pay | Admitting: Gastroenterology

## 2020-10-16 VITALS — BP 116/77 | HR 77 | Temp 98.4°F | Resp 16 | Ht 70.0 in | Wt 244.0 lb

## 2020-10-16 DIAGNOSIS — K3189 Other diseases of stomach and duodenum: Secondary | ICD-10-CM

## 2020-10-16 DIAGNOSIS — Z800 Family history of malignant neoplasm of digestive organs: Secondary | ICD-10-CM

## 2020-10-16 DIAGNOSIS — A048 Other specified bacterial intestinal infections: Secondary | ICD-10-CM

## 2020-10-16 DIAGNOSIS — R1010 Upper abdominal pain, unspecified: Secondary | ICD-10-CM | POA: Diagnosis not present

## 2020-10-16 MED ORDER — SODIUM CHLORIDE 0.9 % IV SOLN: 500.0000 mL | Freq: Once | INTRAVENOUS | Status: DC

## 2020-10-16 NOTE — Patient Instructions (Signed)
Normal colonoscopy!  Next colonoscopy in 10 years  Biopsies taken in stomach- Dr. Myrtie Neither will get the results for you ASAP  Continue your normal medications  USTED TUVO UN PROCEDIMIENTO ENDOSCPICO HOY EN EL [facility]:   Lea el informe del procedimiento que se le entreg para cualquier pregunta especfica sobre lo que se Dentist.  Si el informe del examen no responde a sus preguntas, por favor llame a su gastroenterlogo para aclararlo.  Si usted solicit que no se le den [employer] de lo que se Clinical cytogeneticist en su procedimiento al [employer] va a cuidar, entonces el informe del procedimiento se ha incluido en un sobre sellado para que usted lo revise despus cuando le sea ms conveniente.   LO QUE PUEDE ESPERAR: Algunas sensaciones de hinchazn en el abdomen.  Puede tener ms gases de lo normal.  El caminar puede ayudarle a eliminar el aire que se le puso en el tracto gastrointestinal durante el procedimiento y reducir la hinchazn.  Si le hicieron una endoscopia inferior (como una colonoscopia o una sigmoidoscopia flexible), podra notar manchas de sangre en las heces fecales o en el papel higinico.  Si se someti a una preparacin intestinal para su procedimiento, es posible que no tenga una evacuacin intestinal normal durante [employer].   Tenga en cuenta:  Es posible que note un poco de irritacin y congestin en la nariz o algn drenaje.  Esto es debido al oxgeno [employer] durante su procedimiento.  No hay que preocuparse y esto debe desaparecer ms o Regulatory affairs officer.   SNTOMAS PARA REPORTAR INMEDIATAMENTE:  Despus de una endoscopia inferior (colonoscopia o sigmoidoscopia flexible):  Cantidades excesivas de sangre en las heces fecales  Sensibilidad significativa o empeoramiento de los dolores abdominales   Hinchazn aguda del abdomen que antes no tena   Fiebre de 100F o ms   Despus de la endoscopia superior (EGD)  Vmitos de Retail buyer o material  como caf molido   Dolor en el pecho o dolor debajo de los omplatos que antes no tena   Dolor o dificultad persistente para tragar  Falta de aire que antes no tena   Fiebre de 100F o ms  Heces fecales negras y pegajosas   Para asuntos urgentes o de Associate Professor, puede comunicarse con un gastroenterlogo a cualquier hora llamando al [phone].  DIETA:  Recomendamos una comida pequea al principio, pero luego puede continuar con su dieta normal.  Tome muchos lquidos, Tax adviser las bebidas alcohlicas durante 24 horas.    ACTIVIDAD:  Debe planear tomarse las cosas con calma por el resto del da y no debe CONDUCIR ni usar maquinaria pesada Patent examiner (debido a los medicamentos de sedacin utilizados durante el examen).     SEGUIMIENTO: Nuestro personal llamar al nmero que aparece en su historial al siguiente da hbil de su procedimiento para ver cmo se siente y para responder cualquier pregunta o inquietud que pueda tener con respecto a la informacin que se le dio despus del procedimiento. Si no podemos contactarle, le dejaremos un mensaje.  Sin embargo, si se siente bien y no tiene English as a second language teacher, no es necesario que nos devuelva la llamada.  Asumiremos que ha regresado a sus actividades diarias normales sin incidentes. Si se le tomaron algunas biopsias, le contactaremos por telfono o por carta en las prximas 3 semanas.  Si no ha sabido [employer] biopsias en el transcurso de 3 semanas, por favor llmenos al [phone].   FIRMAS/CONFIDENCIALIDAD: Usted y/o el acompaante que le cuide han firmado documentos que se ingresarn en su historial mdico electrnico.  Estas firmas atestiguan el hecho de que la informacin anterior

## 2020-10-16 NOTE — Progress Notes (Signed)
Report given to PACU, vss 

## 2020-10-16 NOTE — Progress Notes (Signed)
Called to room to assist during endoscopic procedure.  Patient ID and intended procedure confirmed with present staff. Received instructions for my participation in the procedure from the performing physician.  

## 2020-10-16 NOTE — Op Note (Signed)
Eugene Olsen Patient Name: Eugene Olsen Procedure Date: 10/16/2020 1:11 PM MRN: [id number] Endoscopist: Sherilyn Cooter L. Myrtie Neither , MD Age: 45 Referring MD:  Date of Birth: 1975-09-21 Gender: Male Account #: [id number] Procedure:                Upper GI endoscopy Indications:              Upper abdominal pain, Previously treated for                            Helicobacter pylori Medicines:                Monitored Anesthesia Care Procedure:                Pre-Anesthesia Assessment:                           - Prior to the procedure, a History and Physical                            was performed, and patient medications and                            allergies were reviewed. The patient's tolerance of                            previous anesthesia was also reviewed. The risks                            and benefits of the procedure and the sedation                            options and risks were discussed with the patient.                            All questions were answered, and informed consent                            was obtained. Prior Anticoagulants: The patient has                            taken no previous anticoagulant or antiplatelet                            agents. ASA Grade Assessment: II - A patient with                            mild systemic disease. After reviewing the risks                            and benefits, the patient was deemed in                            satisfactory condition to undergo the procedure.  After obtaining informed consent, the endoscope was                            passed under direct vision. Throughout the                            procedure, the patient's blood pressure, pulse, and                            oxygen saturations were monitored continuously. The                            Endoscope was introduced through the mouth, and                            advanced to the second part of duodenum.  The upper                            GI endoscopy was accomplished without difficulty.                            The patient tolerated the procedure well. Scope In: Scope Out: Findings:                 The larynx was normal.                           The esophagus was normal.                           The entire examined stomach was normal. Biopsies                            were taken with a cold forceps for histology                            (antrum and body).                           The cardia and gastric fundus were normal on                            retroflexion.                           The examined duodenum was normal. Complications:            No immediate complications. Estimated Blood Loss:     Estimated blood loss was minimal. Impression:               - Normal larynx.                           - Normal esophagus.                           - Normal stomach. Biopsied.                           -  Normal examined duodenum. Recommendation:           - Patient has a contact number available for                            emergencies. The signs and symptoms of potential                            delayed complications were discussed with the                            patient. Return to normal activities tomorrow.                            Written discharge instructions were provided to the                            patient.                           - Resume previous diet.                           - Continue present medications.                           - Await pathology results.                           - See the other procedure note for documentation of                            additional recommendations. [name] L. Myrtie Neither, MD 10/16/2020 1:52:03 PM This report has been signed electronically.

## 2020-10-16 NOTE — Progress Notes (Signed)
Vitals-CW  History updated.

## 2020-10-16 NOTE — Op Note (Signed)
[facility] Patient Name: Eugene Olsen Procedure Date: 10/16/2020 1:09 PM MRN: [id number] Endoscopist: Sherilyn Cooter L. Myrtie Neither , MD Age: 45 Referring MD:  Date of Birth: 1975-11-22 Gender: Male Account #: [id number] Procedure:                Colonoscopy Indications:              Colon cancer screening in patient at increased                            risk: Family history of colorectal cancer in                            multiple 2nd degree relatives Medicines:                Monitored Anesthesia Care Procedure:                Pre-Anesthesia Assessment:                           - Prior to the procedure, a History and Physical                            was performed, and patient medications and                            allergies were reviewed. The patient's tolerance of                            previous anesthesia was also reviewed. The risks                            and benefits of the procedure and the sedation                            options and risks were discussed with the patient.                            All questions were answered, and informed consent                            was obtained. Prior Anticoagulants: The patient has                            taken no previous anticoagulant or antiplatelet                            agents. ASA Grade Assessment: II - A patient with                            mild systemic disease. After reviewing the risks                            and benefits, the patient was deemed in  satisfactory condition to undergo the procedure.                           After obtaining informed consent, the colonoscope                            was passed under direct vision. Throughout the                            procedure, the patient's blood pressure, pulse, and                            oxygen saturations were monitored continuously. The                            Olympus CF-[id number])  Colonoscope was                            introduced through the anus and advanced to the the                            cecum, identified by appendiceal orifice and                            ileocecal valve. The colonoscopy was performed                            without difficulty. The patient tolerated the                            procedure well. The quality of the bowel                            preparation was excellent. The ileocecal valve,                            appendiceal orifice, and rectum were photographed.                            The bowel preparation used was Plenvu. Scope In: 1:36:10 PM Scope Out: 1:49:17 PM Scope Withdrawal Time: 0 hours 9 minutes 18 seconds  Total Procedure Duration: 0 hours 13 minutes 7 seconds  Findings:                 The perianal and digital rectal examinations were                            normal.                           The entire examined colon appeared normal on direct                            and retroflexion views. Complications:            No immediate complications. Estimated  Blood Loss:     Estimated blood loss: none. Impression:               - The entire examined colon is normal on direct and                            retroflexion views.                           - No specimens collected. Recommendation:           - Patient has a contact number available for                            emergencies. The signs and symptoms of potential                            delayed complications were discussed with the                            patient. Return to normal activities tomorrow.                            Written discharge instructions were provided to the                            patient.                           - Resume previous diet.                           - Continue present medications.                           - Repeat colonoscopy in 10 years for screening                            purposes. [name], MD 10/16/2020 1:54:04 PM This report has been signed electronically.

## 2020-10-16 NOTE — Progress Notes (Signed)
1310 Robinul 0.1 mg IV given due large amount of secretions upon assessment.  MD made aware, vss  

## 2020-10-16 NOTE — Progress Notes (Signed)
Interpreter used today at the [facility] for this pt.  Interpreter's name is- Librarian, academic

## 2020-10-21 ENCOUNTER — Telehealth: Payer: Self-pay | Admitting: *Deleted

## 2020-10-21 NOTE — Telephone Encounter (Signed)
  Follow up Call-  Call back number 10/16/2020  Post procedure Call Back phone  # [phone]  Permission to leave phone message Yes     Patient questions:  Do you have a fever, pain , or abdominal swelling? No. Pain Score  0 *  Have you tolerated food without any problems? Yes.    Have you been able to return to your normal activities? Yes.    Do you have any questions about your discharge instructions: Diet   No. Medications  No. Follow up visit  No.  Do you have questions or concerns about your Care? No.  Actions: * If pain score is 4 or above: No action needed, pain <4.  1. Have you developed a fever since your procedure? no  2.   Have you had an respiratory symptoms (SOB or cough) since your procedure? no  3.   Have you tested positive for COVID 19 since your procedure no  4.   Have you had any family members/close contacts diagnosed with the COVID 19 since your procedure?  no   If yes to any of these questions please route to Laverna Peace, RN and Karlton Lemon, RN

## 2020-10-21 NOTE — Telephone Encounter (Signed)
Attempted f/u phone call. No answer. Left message. °

## 2020-11-04 ENCOUNTER — Telehealth: Payer: Self-pay

## 2020-11-04 DIAGNOSIS — K769 Liver disease, unspecified: Secondary | ICD-10-CM

## 2020-11-04 DIAGNOSIS — R932 Abnormal findings on diagnostic imaging of liver and biliary tract: Secondary | ICD-10-CM

## 2020-11-04 NOTE — Telephone Encounter (Signed)
Left detailed message on patient's voicemail letting him know that we are ordering a repeat MRI of the liver. Advised that the radiology schedulers will call him directly to schedule and appointment. Advised that if he does not hear from them within a week to give them a call to schedule. Advised patient to give me a call if he had any questions or concerns.  Staff message sent to April Pait and Lesly Rubenstein - radiology schedulers.

## 2020-11-04 NOTE — Telephone Encounter (Signed)
-----   Message from Loretha Stapler, RN sent at 10/08/2020  2:07 PM EDT ----- Waynetta Sandy or covering RN, please place a reminder in the computer to arrange for an MRI of the abdomen WITH EOVIST for evaluation of right liver lesion on MRI May 2022.  Patient will likely be seeing Dr. Myrtie Neither in clinic before this MRI gets scheduled but in case he missed his appointment then this will serve as a reminder for the MRI. Thanks

## 2020-11-14 ENCOUNTER — Ambulatory Visit (HOSPITAL_COMMUNITY)
Admission: RE | Admit: 2020-11-14 | Discharge: 2020-11-14 | Disposition: A | Discharge status: Home / Self Care | Payer: 59 | Source: Ambulatory Visit | Attending: Gastroenterology | Admitting: Gastroenterology

## 2020-11-14 ENCOUNTER — Other Ambulatory Visit: Payer: Self-pay

## 2020-11-14 DIAGNOSIS — R932 Abnormal findings on diagnostic imaging of liver and biliary tract: Secondary | ICD-10-CM | POA: Diagnosis present

## 2020-11-14 DIAGNOSIS — K769 Liver disease, unspecified: Secondary | ICD-10-CM | POA: Insufficient documentation

## 2020-11-14 IMAGING — MR MR ABDOMEN WO/W CM
18 series · 48 of 48 positions shown · IV contrast (10 eovist)
Comparison: Comparison made with [DATE]

CLINICAL DATA: RIGHT hepatic lobe lesion seen on previous MRI.
Previous MR recommendation outlines abnormality and prescribed use
of Eovist contrast.

EXAM:
MRI ABDOMEN WITHOUT AND WITH CONTRAST
TECHNIQUE: Multiplanar multisequence MR imaging of the abdomen was performed
both before and after the administration of intravenous contrast.
CONTRAST:  10mL EOVIST GADOXETATE DISODIUM 0.25 MOL/L IV SOLN

[Series 3: T2 · coronal · 6.0mm · 1.56mm/px · 2 of 35 slices shown (1 of 2)]
[im 1/35]
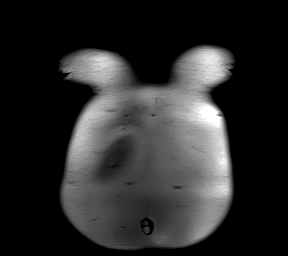
[im 35/35]
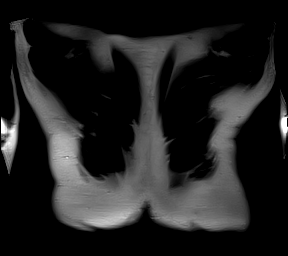

[Series 4: T1 · axial · 3.0mm · 1.25mm/px · z∈[-165,+120]mm · 4 of 96 slices shown (1 of 2)]
[im 1/96]
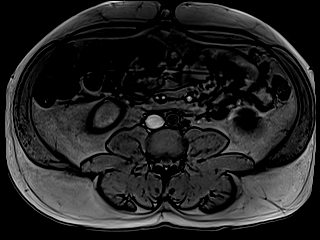
[im 32/96]
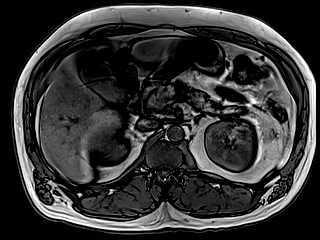
[im 64/96]
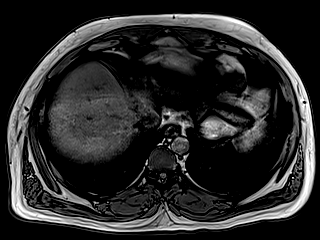
[im 96/96]
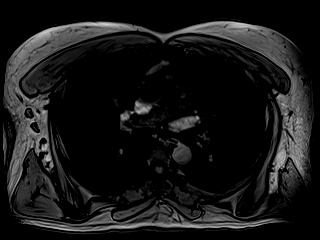

[Series 5: T1 · axial · 3.0mm · 1.25mm/px · z∈[-165,+120]mm · 4 of 96 slices shown (2 of 2)]
[im 1/96]
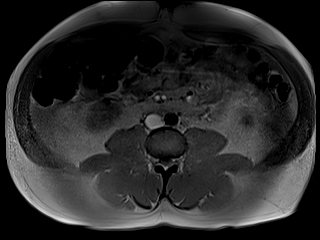
[im 32/96]
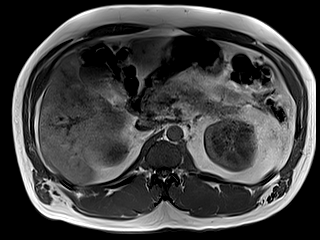
[im 64/96]
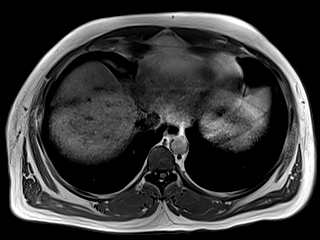
[im 96/96]
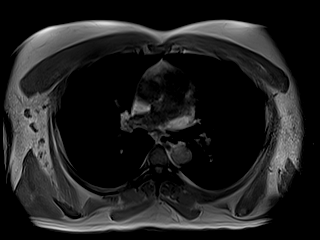

[Series 7: T1 dynamic · axial · 3.0mm · 1.25mm/px · z∈[-164,+121]mm · 4 of 96 slices shown (1 of 8)]
[im 1/96]
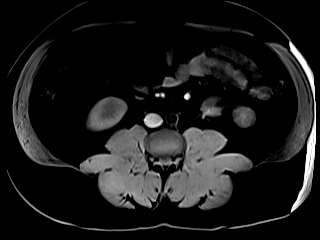
[im 32/96]
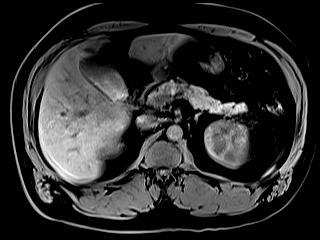
[im 64/96]
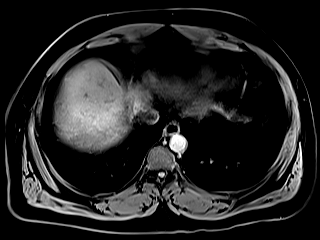
[im 96/96]
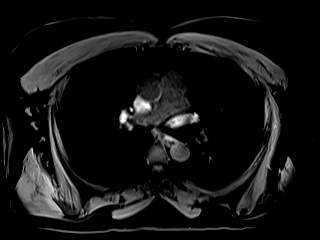

[Series 11: T1 dynamic · axial · 3.0mm · 1.25mm/px · z∈[-164,+121]mm · 3 of 96 slices shown (2 of 8)]
[im 1/96]
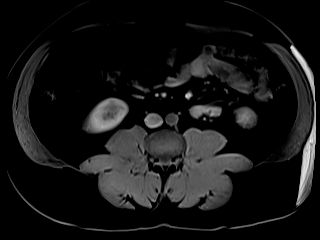
[im 48/96]
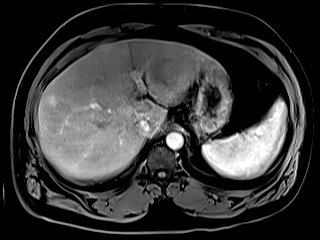
[im 96/96]
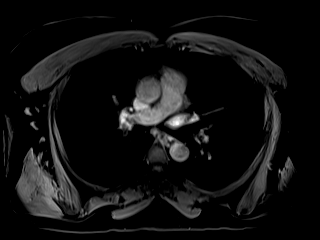

[Series 12: T1 dynamic · axial · 3.0mm · 1.25mm/px · z∈[-164,+121]mm · 3 of 96 slices shown (3 of 8)]
[im 1/96]
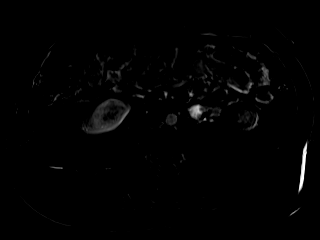
[im 48/96]
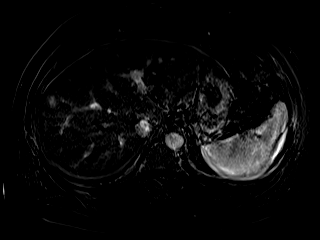
[im 96/96]
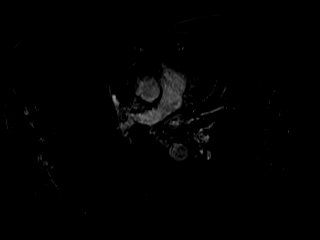

[Series 15: T1 dynamic · axial · 3.0mm · 1.25mm/px · z∈[-164,+121]mm · 3 of 96 slices shown (4 of 8)]
[im 1/96]
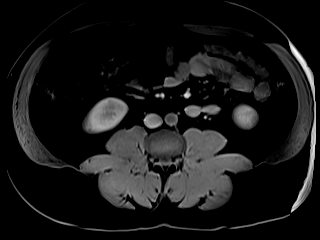
[im 48/96]
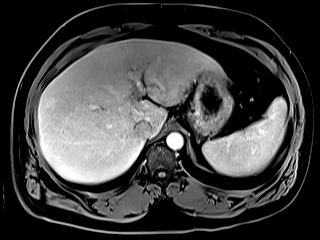
[im 96/96]
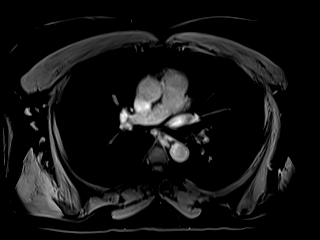

[Series 16: T1 dynamic · axial · 3.0mm · 1.25mm/px · z∈[-164,+121]mm · 3 of 96 slices shown (5 of 8)]
[im 1/96]
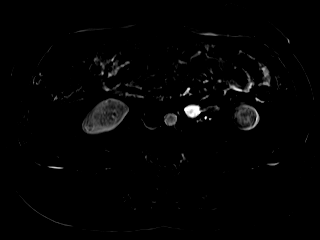
[im 48/96]
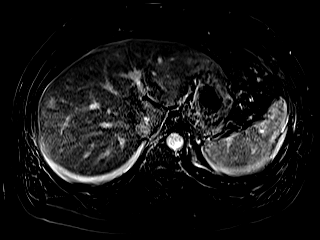
[im 96/96]
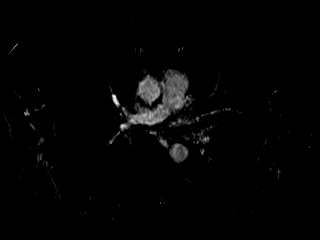

[Series 19: T1 dynamic · axial · 3.0mm · 1.25mm/px · z∈[-164,+121]mm · 3 of 96 slices shown (6 of 8)]
[im 1/96]
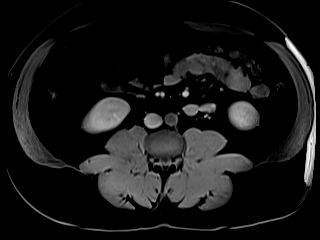
[im 48/96]
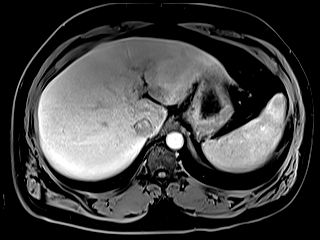
[im 96/96]
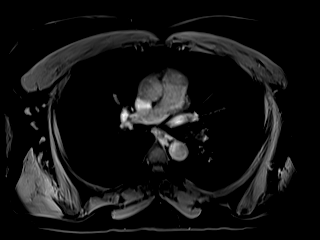

[Series 20: T1 dynamic · axial · 3.0mm · 1.25mm/px · z∈[-164,+121]mm · 3 of 96 slices shown (7 of 8)]
[im 1/96]
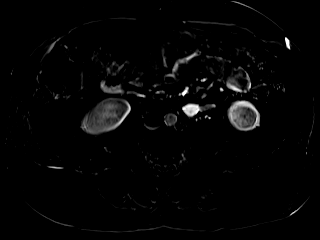
[im 48/96]
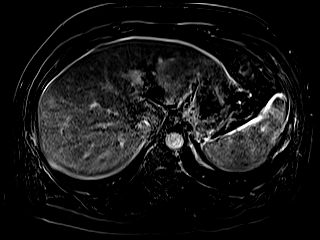
[im 96/96]
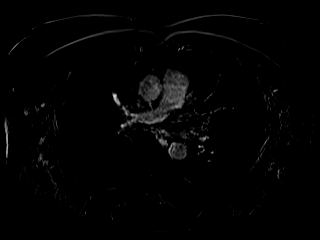

[Series 22: T1 dynamic · coronal · 5.0mm · 1.41mm/px · 2 of 56 slices shown (8 of 8)]
[im 1/56]
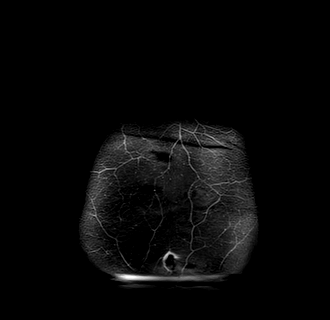
[im 56/56]
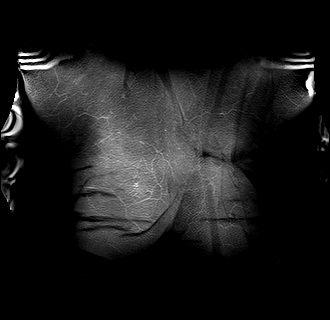

[Series 23: T2 · axial · 6.0mm · 1.56mm/px · 1 of 40 slices shown (2 of 2)]
[im 1/40]
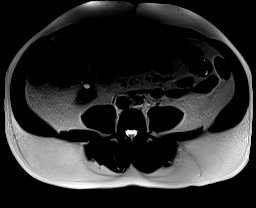

[Series 25: T2 fat-sat · axial · 6.0mm · 0.78mm/px · 1 of 38 slices shown]
[im 1/38]
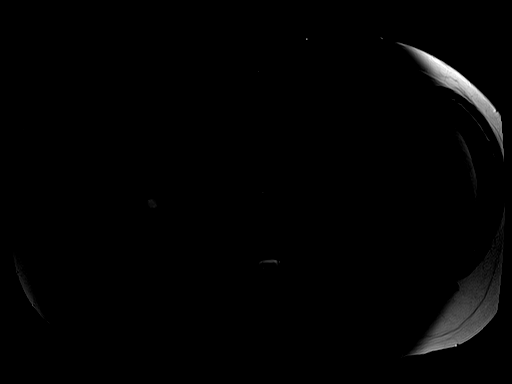

[Series 26: DWI · axial · 6.0mm · 1.49mm/px · z∈[-174,+107]mm · 3 of 80 slices shown (1 of 2)]
[im 1/80]
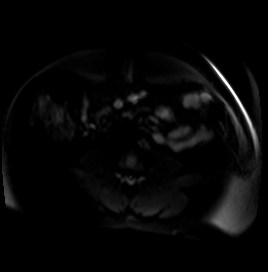
[im 40/80]
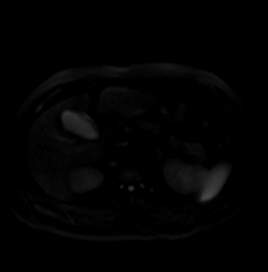
[im 80/80]
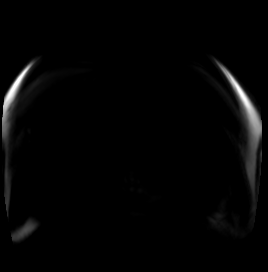

[Series 27: DWI · axial · 6.0mm · 1.49mm/px · 1 of 40 slices shown (2 of 2)]
[im 1/40]
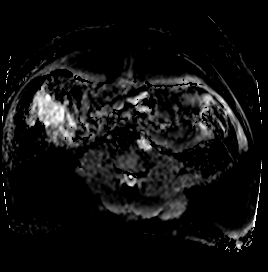

[Series 28: bSSFP · axial · 4.0mm · 0.84mm/px · z∈[-163,+101]mm · 2 of 67 slices shown]
[im 1/67]
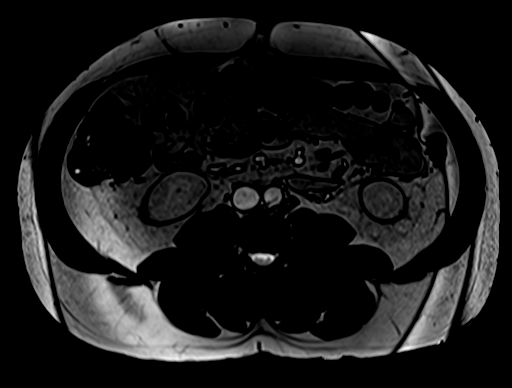
[im 67/67]
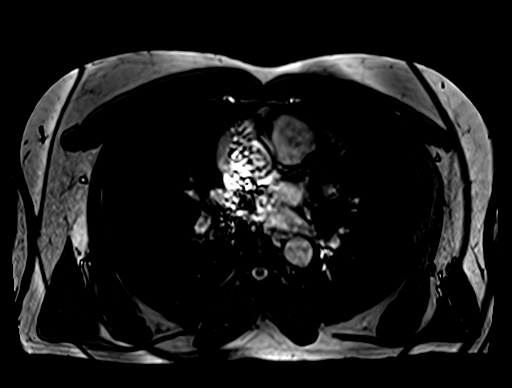

[Series 31: T1 dynamic fat-sat · axial · 3.0mm · 1.25mm/px · z∈[-164,+121]mm · 3 of 96 slices shown (1 of 2)]
[im 1/96]
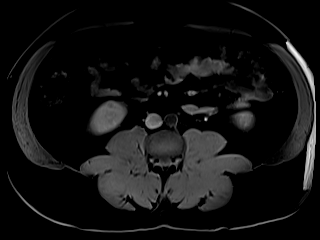
[im 48/96]
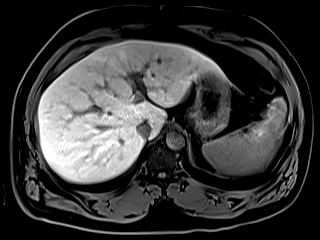
[im 96/96]
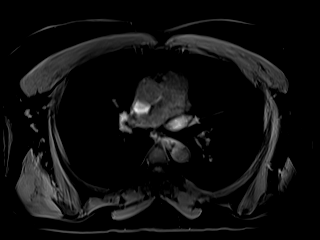

[Series 32: T1 dynamic fat-sat · axial · 3.0mm · 1.25mm/px · z∈[-164,+121]mm · 3 of 96 slices shown (2 of 2)]
[im 1/96]
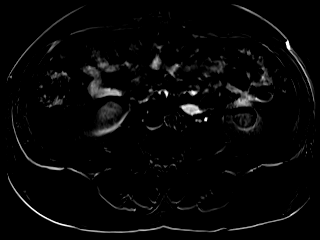
[im 48/96]
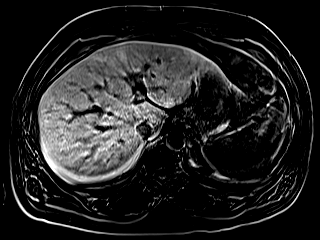
[im 96/96]
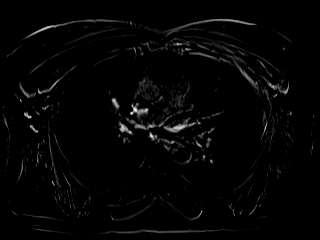

[48 of 48 positions shown; findings below may reference images not displayed]

FINDINGS: Lower chest: Incidental imaging of the lung bases without effusion
or sign of consolidative changes. Limited assessment on MRI.

Hepatobiliary: Perhaps very mild hepatic steatosis. Sludge in the
dependent gallbladder. No pericholecystic stranding. No biliary duct
dilation.

Lesion in the RIGHT hemi liver again displays hypervascular features
measuring approximately 1.5 cm. (Image 49/11). Lesion follows blood
pool on previous MRI. Nearly invisible following arterial and venous
phase on the current study. Diffusion-weighted imaging also with
fading pattern favoring benign process. Imaging no additional lesion
in the liver.

Portal vein is patent.

Pancreas: Normal intrinsic T1 signal in the pancreas. No signs of
adjacent inflammation or ductal dilation.

Spleen:  Normal

Adrenals/Urinary Tract: Small cyst in the lower pole the RIGHT
kidney. Normal adrenal glands. No hydronephrosis. No perinephric
stranding.

Stomach/Bowel: Normal to the extent visualized on abdominal MRI not
performed for bowel evaluation.

Vascular/Lymphatic: Patent abdominal vessels. No aneurysmal dilation
of the abdominal aorta. There is no gastrohepatic or hepatoduodenal
ligament lymphadenopathy. No retroperitoneal or mesenteric
lymphadenopathy.

Other:  No ascites.

Musculoskeletal: No suspicious bone lesions identified.
IMPRESSION: 1. Stable hypervascular lesion in the RIGHT hemi liver. Imaging
characteristics favor a small benign vascular shunt lesion in the
RIGHT hepatic lobe, small hemangioma or FNH are also possible. In
the absence of any history of neoplasm this is compatible with a
benign process; vascular shunt lesion is favored given that it
follows blood pool on the prior study and may track between portal
and hepatic venous branches at the periphery of the liver.
2. Mild hepatic steatosis.
3. Sludge in the dependent gallbladder.

## 2020-11-14 MED ORDER — GADOXETATE DISODIUM 0.25 MMOL/ML IV SOLN
10.0000 mL | Freq: Once | INTRAVENOUS | Status: AC | PRN
  Administered 2020-11-14: 10 mL via INTRAVENOUS

## 2021-04-02 ENCOUNTER — Ambulatory Visit: Payer: 59 | Admitting: Family Medicine

## 2021-08-08 ENCOUNTER — Telehealth: Payer: Self-pay | Admitting: General Practice

## 2021-08-08 NOTE — Telephone Encounter (Addendum)
Transition Care Management Unsuccessful Follow-up Telephone Call  Date of discharge and from where:  08/06/21 from [facility].  Attempts:  1st Attempt  Reason for unsuccessful TCM follow-up call:  Left voice message

## 2021-08-11 NOTE — Telephone Encounter (Signed)
Transition Care Management Unsuccessful Follow-up Telephone Call  Date of discharge and from where:  08/06/21 from [facility]  Attempts:  2nd Attempt  Reason for unsuccessful TCM follow-up call:  Left voice message

## 2021-08-13 NOTE — Telephone Encounter (Signed)
Transition Care Management Unsuccessful Follow-up Telephone Call  Date of discharge and from where:  08/06/21 from [facility]  Attempts:  3rd Attempt  Reason for unsuccessful TCM follow-up call:  No answer/busy

## 2022-08-10 ENCOUNTER — Telehealth: Payer: Self-pay | Admitting: General Practice

## 2022-08-10 NOTE — Telephone Encounter (Signed)
Transition Care Management Unsuccessful Follow-up Telephone Call  Date of discharge and from where:  08/09/22 from [facility]  Attempts:  1st Attempt  Reason for unsuccessful TCM follow-up call:  Left voice message

## 2022-08-14 NOTE — Telephone Encounter (Signed)
Transition Care Management Unsuccessful Follow-up Telephone Call  Date of discharge and from where:  08/09/22 from [facility]  Attempts:  2nd Attempt  Reason for unsuccessful TCM follow-up call:  Left voice message

## 2022-08-17 NOTE — Telephone Encounter (Signed)
Transition Care Management Unsuccessful Follow-up Telephone Call  Date of discharge and from where:  08/09/22 from [place]  Attempts:  3rd Attempt  Reason for unsuccessful TCM follow-up call:  Left voice message
